# Patient Record
Sex: Female | Born: 1955 | Race: White | Hispanic: No | Marital: Married | Smoking: Former smoker
Health system: Southern US, Community
[De-identification: ages and names within clinical notes are randomized; demographics above are authoritative.]

## PROBLEM LIST (undated history)

## (undated) DIAGNOSIS — M810 Age-related osteoporosis without current pathological fracture: Secondary | ICD-10-CM

## (undated) DIAGNOSIS — Z9289 Personal history of other medical treatment: Secondary | ICD-10-CM

## (undated) DIAGNOSIS — M1990 Unspecified osteoarthritis, unspecified site: Secondary | ICD-10-CM

## (undated) DIAGNOSIS — E559 Vitamin D deficiency, unspecified: Secondary | ICD-10-CM

## (undated) DIAGNOSIS — R748 Abnormal levels of other serum enzymes: Secondary | ICD-10-CM

## (undated) HISTORY — DX: Unspecified osteoarthritis, unspecified site: M19.90

## (undated) HISTORY — DX: Age-related osteoporosis without current pathological fracture: M81.0

## (undated) HISTORY — DX: Personal history of other medical treatment: Z92.89

## (undated) HISTORY — DX: Abnormal levels of other serum enzymes: R74.8

## (undated) HISTORY — PX: TRIGGER FINGER RELEASE: SHX641

## (undated) HISTORY — DX: Vitamin D deficiency, unspecified: E55.9

---

## 1998-05-22 ENCOUNTER — Other Ambulatory Visit: Admission: RE | Admit: 1998-05-22 | Discharge: 1998-05-22 | Payer: Self-pay | Admitting: Obstetrics and Gynecology

## 1999-10-29 ENCOUNTER — Other Ambulatory Visit: Admission: RE | Admit: 1999-10-29 | Discharge: 1999-10-29 | Payer: Self-pay | Admitting: Obstetrics and Gynecology

## 2000-10-27 ENCOUNTER — Other Ambulatory Visit: Admission: RE | Admit: 2000-10-27 | Discharge: 2000-10-27 | Payer: Self-pay | Admitting: Obstetrics and Gynecology

## 2001-11-05 ENCOUNTER — Other Ambulatory Visit: Admission: RE | Admit: 2001-11-05 | Discharge: 2001-11-05 | Payer: Self-pay | Admitting: Obstetrics and Gynecology

## 2002-11-11 ENCOUNTER — Other Ambulatory Visit: Admission: RE | Admit: 2002-11-11 | Discharge: 2002-11-11 | Payer: Self-pay | Admitting: Obstetrics and Gynecology

## 2003-11-18 ENCOUNTER — Other Ambulatory Visit: Admission: RE | Admit: 2003-11-18 | Discharge: 2003-11-18 | Payer: Self-pay | Admitting: Obstetrics and Gynecology

## 2004-09-22 ENCOUNTER — Encounter: Admission: RE | Admit: 2004-09-22 | Discharge: 2004-09-22 | Payer: Self-pay | Admitting: Orthopedic Surgery

## 2004-11-16 ENCOUNTER — Encounter: Admission: RE | Admit: 2004-11-16 | Discharge: 2004-11-16 | Payer: Self-pay | Admitting: Orthopedic Surgery

## 2004-12-09 ENCOUNTER — Other Ambulatory Visit: Admission: RE | Admit: 2004-12-09 | Discharge: 2004-12-09 | Payer: Self-pay | Admitting: Obstetrics and Gynecology

## 2005-02-07 HISTORY — PX: HIP ARTHROSCOPY W/ LABRAL REPAIR: SHX1750

## 2005-03-21 ENCOUNTER — Ambulatory Visit (HOSPITAL_COMMUNITY): Admission: RE | Admit: 2005-03-21 | Discharge: 2005-03-21 | Payer: Self-pay | Admitting: Orthopedic Surgery

## 2005-12-13 ENCOUNTER — Other Ambulatory Visit: Admission: RE | Admit: 2005-12-13 | Discharge: 2005-12-13 | Payer: Self-pay | Admitting: Obstetrics & Gynecology

## 2006-12-21 ENCOUNTER — Other Ambulatory Visit: Admission: RE | Admit: 2006-12-21 | Discharge: 2006-12-21 | Payer: Self-pay | Admitting: Obstetrics & Gynecology

## 2007-02-08 HISTORY — PX: CARPAL TUNNEL RELEASE: SHX101

## 2007-07-21 LAB — HM COLONOSCOPY

## 2008-02-20 ENCOUNTER — Other Ambulatory Visit: Admission: RE | Admit: 2008-02-20 | Discharge: 2008-02-20 | Payer: Self-pay | Admitting: Obstetrics & Gynecology

## 2010-06-25 NOTE — Op Note (Signed)
NAMEJAMILEX, Mindy Juarez NO.:  [id number]   MEDICAL RECORD NO.:  [id number]          PATIENT TYPE:  AMB   LOCATION:  DAY                          FACILITY:  [facility]   PHYSICIAN:  Ollen Gross, M.D.    DATE OF BIRTH:  1955/08/16   DATE OF PROCEDURE:  03/21/2005  DATE OF DISCHARGE:                                 OPERATIVE REPORT   PREOPERATIVE DIAGNOSIS:  Left hip labral tear.   POSTOPERATIVE DIAGNOSIS:  Left hip labral tear.   PROCEDURE:  Left hip arthroscopy with labral debridement.   SURGEON:  Ollen Gross, M.D.   ASSISTANT:  Avel Peace, PA-C.   ANESTHESIA:  General.   ESTIMATED BLOOD LOSS:  Minimal.   DRAINS:  None.   COMPLICATIONS:  None.   CONDITION:  Stable to recovery.   CLINICAL NOTE:  Mindy Juarez is a 55 year old female with significant left  hip pain and mechanical symptoms. Exam and history suggested a labral tear.  She has had intractable pain and presents now for arthroscopy and  debridement.   DESCRIPTION OF PROCEDURE:  After successful administration of general  anesthetic, the patient is placed in the right lateral decubitus position  with the left side up on the fracture table. The well-padded perineal post  is placed with the left thigh draped over the post. Her left foot is put  into the well-holder foot holder. Under fluoroscopic guidance, traction is  applied until the joint is adequately distracted. Traction is locked in that  position and then the thigh prepped and draped in the usual sterile fashion.  The standard anterior and posterior super trochanteric portals are  identified and spinal needles passed from those sites down into the joint.  Saline is injected through the posterior needle and shows outflow through  the anterior needle confirming that both are intra-articular. The nitinol  wire is passed through the posterior needle and the needle was removed. A  small incision is made and the dilator is placed. The 5 mm  cannula was  placed and then the camera is passed into the joint and inflow initiated.  The femoral head looked normal throughout. The fovea looked normal. The  entire anterior half of the joint looked normal including the labrum and  acetabular cartilage. Looking superior at the 12 o'clock position going all  the way to the 3 o'clock position, there is evidence of a tear in the  posterior superior labrum with significant fraying also. This appears to be  an unstable tear but there is no evidence of any labral detachment. We then  created the anterior portal and switched the camera anterior and the working  portal posterior. Using a combination of the 4.2 mm shaver and the  ArthroCare device, the tear in the labrum is debrided back to a stable base  and sealed off with the ArthroCare. There is no evidence of any chondral  defect on the acetabulum. The rest of the labrum posteriorly and inferiorly  looked fine. Once the debridement is completed then 20 mL of 0.5% Marcaine  with epi are injected through the inflow cannula and  the arthroscopic  equipment is removed. The traction is released from the joint and a  fluoroscopic picture confirms that concentric reduction is achieved. The  incisions are then closed with interrupted 4-0 nylon and a bulky sterile  dressing applied. She is then awakened and transported to recovery in stable  condition.      Ollen Gross, M.D.  Electronically Signed     FA/MEDQ  D:  03/21/2005  T:  03/22/2005  Job:  [id number]

## 2011-04-01 LAB — HM PAP SMEAR: HM Pap smear: NEGATIVE

## 2012-04-19 LAB — HM DEXA SCAN

## 2012-06-05 ENCOUNTER — Ambulatory Visit (INDEPENDENT_AMBULATORY_CARE_PROVIDER_SITE_OTHER): Payer: BC Managed Care – PPO | Admitting: Sports Medicine

## 2012-06-05 VITALS — BP 120/70 | Ht 66.0 in | Wt 143.0 lb

## 2012-06-05 DIAGNOSIS — M13161 Monoarthritis, not elsewhere classified, right knee: Secondary | ICD-10-CM

## 2012-06-05 DIAGNOSIS — M25569 Pain in unspecified knee: Secondary | ICD-10-CM

## 2012-06-05 DIAGNOSIS — M25561 Pain in right knee: Secondary | ICD-10-CM

## 2012-06-05 DIAGNOSIS — M1710 Unilateral primary osteoarthritis, unspecified knee: Secondary | ICD-10-CM

## 2012-06-05 NOTE — Patient Instructions (Addendum)
Try knee sleeve for activity and for 30 min to 1 hr after- do not sleep in knee sleeve  Ice if you have been doing a lot of activity  Please follow up in 4 weeks  Weight bearing activity- (walking or running) 3-4 times per week does not usually increase arthritis  Thank you for seeing [place] today!

## 2012-06-05 NOTE — Assessment & Plan Note (Signed)
Because of the large effusion we will use compression sleeve unless this creates pain in PF groove  Very good strength  Avoid bent knee exercises  Limit running or weight bearing sports  Reck p 2 mos

## 2012-06-05 NOTE — Assessment & Plan Note (Signed)
Primarily will use OTC meds and ice for pain  If more severe we can add tramadol

## 2012-06-05 NOTE — Progress Notes (Signed)
  Subjective:    Patient ID: Mindy Juarez, female    DOB: 1955/08/21, 57 y.o.   MRN: [id number]  HPI  Pt presents to clinic for evaluation of rt knee pain. Worse with going up and downstairs.  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 In her 86s lost 60 lbs. Now teaching strictly water classes. Does circuit training, weight training, running and power walking. Exercises 6-7 days per week.   At times has warmth at medial joint line on rt.   Dr. Despina Hick- Hx of lt hip labral repair 2007 Had x-rays of lt hip and knee- no arthritis in hip, some in knee in 2012      Review of Systems     Objective:   Physical Exam  Slightly more hypertrophy of medial joint line R>L  Moderate crepitation bilat knees Excellent straight leg raise flexibility SI joints move freely bilat Leg lengths equal Good quad strength bilat -2 deg full extension on rt knee Full extension on lt 160 deg knee flexion bilat Hip abduction strength good bilat Hip flexion strength good on rt  Rt knee- puffiness in suprapatellar pouch  MSK [place] RT knee Large effusion in SP pouch Meniscus unremarable med and lat Very mild joint line spurring Pat Tendon OK Quad tendon OK  However suprerior retropatellar groove shows calcified cartilage and loss of joint space This is not present on left knee         Assessment & Plan:

## 2012-07-31 ENCOUNTER — Ambulatory Visit (INDEPENDENT_AMBULATORY_CARE_PROVIDER_SITE_OTHER): Payer: BC Managed Care – PPO | Admitting: Sports Medicine

## 2012-07-31 VITALS — BP 133/72 | Ht 66.0 in | Wt 143.0 lb

## 2012-07-31 DIAGNOSIS — M25569 Pain in unspecified knee: Secondary | ICD-10-CM

## 2012-07-31 DIAGNOSIS — M25561 Pain in right knee: Secondary | ICD-10-CM

## 2012-07-31 DIAGNOSIS — M13161 Monoarthritis, not elsewhere classified, right knee: Secondary | ICD-10-CM

## 2012-07-31 NOTE — Assessment & Plan Note (Signed)
Pain at acceptable level except when she gets swelling

## 2012-07-31 NOTE — Progress Notes (Signed)
  Subjective:    Patient ID: Mindy Juarez, female    DOB: 1955-09-13, 57 y.o.   MRN: [id number]  HPI 57 yo female who presents for follow up on right knee pain.  Pain is located mostly in posterior aspect of knee and medial aspect. Intermittent. She had a flare up of pain and swelling 2 weeks ago and doesn't recall any change in activity at the time. She took 2 aleve. She wore her compression sleeve during the days following this which improved pain and swelling.  She has been able to run up to 2-3 miles without significant pain. 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 [employer]  Review of Systems Negative except per HPI    Objective:   Physical Exam Filed Vitals:   07/31/12 1551  BP: 133/72   General: pleasant, athletic appearing woman Right Knee: mild suprapatellar effusion palpated, no fullness on posterior aspect of knee no tenderness to palpation Normal knee flexion and extension with 5/5 strength, 5/5 strength quad strength 5/5 abduction strength bilaterally, 5/5 adduction strength bilaterally  MSK Ultrasound: Right suprapatellar pouch effusion present, stable from previous exam Improved effusion under vastus lateralis and vastus medialis tendons.  Parameniscal cyst is no longer present medially Note lateral meniscus better seen and does not appear torn Calcification in the retropatellar cartilage of RT knee only     Assessment & Plan:  57 yo female with osteoarthritis who presents for follow up on right knee pain which has improved clinically.  Suprapatellar effusion still present on ultrasound.  - continue aggressive compression to bring swelling down - avoid running sprints for cardio - follow up in 6-8 weeks for monitoring of swelling

## 2012-07-31 NOTE — Assessment & Plan Note (Signed)
This is clinically improved  Mild improvement on [place]  Cont to work on this  Use compression consistently  There is some milder DJD change along joint lines

## 2012-07-31 NOTE — Patient Instructions (Addendum)
Compression sleeve like you're doing and increased wear if you notice any increased pain.  Wearing arch pads. If too bad in left, you can take it out.   Avoid running sprints.   Follow up in 6 weeks.

## 2012-09-11 ENCOUNTER — Ambulatory Visit: Payer: BC Managed Care – PPO | Admitting: Sports Medicine

## 2012-10-02 ENCOUNTER — Ambulatory Visit (INDEPENDENT_AMBULATORY_CARE_PROVIDER_SITE_OTHER): Payer: BC Managed Care – PPO | Admitting: Sports Medicine

## 2012-10-02 VITALS — BP 106/60 | Ht 66.0 in | Wt 145.0 lb

## 2012-10-02 DIAGNOSIS — M13161 Monoarthritis, not elsewhere classified, right knee: Secondary | ICD-10-CM

## 2012-10-02 NOTE — Progress Notes (Signed)
Mindy Juarez is a 57 y.o. female who presents today for f/u of R knee pain.  She was last seen 07/31/12 for suprapatellar effusion with patellofemoral arthritis.  At that time, she was given compression sleeve, strength exercises and OTC pain medication as needed.  Since that point, pain has improved slightly but she is now starting to feel a "clicking" sensation or instability when she is walking up or down stairs.  She denies actual locking, catching, or her knee giving out and denies recent effusion/erythema around the area.  Pain is mainly located behind the knee cap with minimal pain on the medial joint line, which is worse with rising or going down stairs.  OTC anti-inflammatories provided minimal relief and she has continued to use the compression sleeve.    ROS: Per HPI.  All other systems reviewed and are negative.   Physical Exam Filed Vitals:   10/02/12 1507  BP: 106/60    Physical Examination: General: NAD  Right Knee: no effusion noted, no gross deformity or erythema, no fullness on posterior aspect of knee  Minimal tenderness to palpation medial joint line + patellar crepitus with flexion/extension Normal knee flexion and extension with 5/5 F/E strength  5/5 abduction strength bilaterally, 5/5 adduction strength bilaterally  Ligaments with solid consistent endpoints including ACL, PCL, LCL, MCL. + Thessaly on R side Patellar and quadriceps tendons unremarkable. Hamstring and quadriceps strength is normal.   MSK Ultrasound:  Right suprapatellar pouch effusion present, stable from previous exam, possible minimal improvement  Parameniscal cyst is no longer present medially on the R side  Intact lateral meniscus with small calcification on the tibial plateau  Calcification in the retropatellar cartilage of RT knee visible is minimally visualized compared to previous exam

## 2012-10-02 NOTE — Assessment & Plan Note (Signed)
Pt has improved to where she is having only pain with hills, either up or down.  Now c/o clicking and possible feeling of instability.  This may be related to either effusion or possible intraarticular meniscal tear.  For now, continue with compression sleeve as this seems to be greatly improving her Sx.  Continue with water exercises and will see back in three months.

## 2012-12-25 ENCOUNTER — Encounter: Payer: Self-pay | Admitting: Sports Medicine

## 2012-12-25 ENCOUNTER — Ambulatory Visit (INDEPENDENT_AMBULATORY_CARE_PROVIDER_SITE_OTHER): Payer: BC Managed Care – PPO | Admitting: Sports Medicine

## 2012-12-25 VITALS — BP 136/84 | Ht 66.0 in | Wt 145.0 lb

## 2012-12-25 DIAGNOSIS — M25561 Pain in right knee: Secondary | ICD-10-CM

## 2012-12-25 DIAGNOSIS — M25569 Pain in unspecified knee: Secondary | ICD-10-CM

## 2012-12-25 DIAGNOSIS — M13161 Monoarthritis, not elsewhere classified, right knee: Secondary | ICD-10-CM

## 2012-12-25 NOTE — Assessment & Plan Note (Signed)
This appears stable and I am not sure how much OA is present as some of swelling may be degenerative meniscus

## 2012-12-25 NOTE — Progress Notes (Signed)
Patient ID: NIMSI MALES, female   DOB: 21-Sep-1955, 57 y.o.   MRN: [id number]  SUBJECTIVE: Mindy Juarez is a 57 y.o. female who presents today for f/u of R knee pain. She was last seen 10/02/12 for suprapatellar effusion with patellofemoral arthritis. At that time, she was given compression sleeve, strength exercises and OTC pain medication as needed. Since that point, pain has improved significantly and has been able to increase her running of a 5K and improved her pace.  She denies actual locking, catching, or her knee has not been giving out and denies recent effusion/erythema around the area. Pain is mainly located behind the patella with minimal pain on the medial joint line with clicking and popping, which is worse with rising or going down stairs. OTC anti-inflammatories provided relief and she has continued to use the compression sleeve. She rarely uses NSAID'S at this point.  ROS:  All other systems reviewed and are negative except what is present in HPI  OBJECTIVE: Physical Examination:  General: NAD  Neuro: A&O x3. Sensation to light touch lower extremities equal and intact bilaterally.  Normal walking and runnig gait in lower torso and hips with some upper body shifting of shoulders and head bobbing. Right Knee: no effusion noted on exam, no gross deformity or erythema, no fullness on posterior aspect of knee  Minimal tenderness to palpation medial joint line and no tenderness on the lateral joint line + patellar crepitus with clicking and popping with flexion/extension  Normal knee flexion and extension with 5/5 F/E strength  4/5 abduction strength on R compared to L, 5/5 adduction strength bilaterally  Ligaments stable with consistent endpoints including ACL, PCL, LCL, MCL.  Patellar and quadriceps tendons unremarkable with no TTP Hamstring and quadriceps strength is 5/5 bilaterally  MSK Ultrasound:  Right suprapatellar pouch effusion mild and decrease in size  significantly Parameniscal cyst is no longer present medially on the R side  Intact lateral meniscus with small calcification on the tibial plateau  Calcification in the retropatellar cartilage of RT knee visible is minimally visualized compared to previous exam Normal QT, PT   ASSESSMENT/PLAN: 1. Suspected medial mensicus tear with resolution of cyst and improvement in suprapatellar recesses effusion  For now, continue with compression sleeve with running and all activities. Avoid aggressive side stepping and shuffling. Continue advancing running. Return in 6 months or soon if needed.

## 2012-12-25 NOTE — Assessment & Plan Note (Addendum)
This continues to improve  Needs to use compression sleeve  HEP  Keep running mileage low  Reck 6 mos

## 2013-04-12 ENCOUNTER — Ambulatory Visit: Payer: Self-pay | Admitting: Nurse Practitioner

## 2013-05-13 ENCOUNTER — Encounter: Payer: Self-pay | Admitting: Nurse Practitioner

## 2013-05-14 ENCOUNTER — Ambulatory Visit (INDEPENDENT_AMBULATORY_CARE_PROVIDER_SITE_OTHER): Payer: BC Managed Care – PPO | Admitting: Nurse Practitioner

## 2013-05-14 ENCOUNTER — Encounter: Payer: Self-pay | Admitting: Nurse Practitioner

## 2013-05-14 VITALS — BP 110/66 | HR 48 | Ht 65.75 in | Wt 147.0 lb

## 2013-05-14 DIAGNOSIS — Z01419 Encounter for gynecological examination (general) (routine) without abnormal findings: Secondary | ICD-10-CM

## 2013-05-14 DIAGNOSIS — Z0000 Encounter for general adult medical examination without abnormal findings: Secondary | ICD-10-CM

## 2013-05-14 LAB — HEMOGLOBIN, FINGERSTICK: HEMOGLOBIN, FINGERSTICK: 13 g/dL (ref 12.0–16.0)

## 2013-05-14 LAB — POCT URINALYSIS DIPSTICK
Bilirubin, UA: NEGATIVE
Blood, UA: NEGATIVE
GLUCOSE UA: NEGATIVE
Ketones, UA: NEGATIVE
Leukocytes, UA: NEGATIVE
Nitrite, UA: NEGATIVE
Protein, UA: NEGATIVE
Urobilinogen, UA: NEGATIVE
pH, UA: 6

## 2013-05-14 NOTE — Progress Notes (Signed)
Encounter reviewed by Dr. Babs Dabbs Silva.  

## 2013-05-14 NOTE — Patient Instructions (Signed)

## 2013-05-14 NOTE — Progress Notes (Signed)
Patient ID: Mindy Juarez, female   DOB: [birth year], 58 y.o.   MRN: [id number] 57 y.o. [id number] Married Caucasian Fe here for annual exam.   She feels well.  recent problems with mothers health who is 24 yrs.old.  She fell in December and had to have extensive repair of femur. Patient also concerned about her history of osteopenia and is doing core strengthening and weight bearing exercise.  Patient's last menstrual period was 11/08/2006.          Sexually active: no  The current method of family planning is post menopausal status.    Exercising: yes  Home exercise routine includes aerobic or strength training 6 times per week. Smoker:  no  Health Maintenance: Pap:  04/01/11, WNL, neg HR HPV MMG:  02/19/13 3D;  Bi-Rads 1: negative Colonoscopy:  07/2007, repeat in 10 years  BMD:  3/14; -2.4/-0.9/-0.5 TDaP:  12/2006 Labs:  HB:  13.0 Urine:  Negative    reports that she has never smoked. She has never used smokeless tobacco. She reports that she drinks alcohol. She reports that she does not use illicit drugs.  History reviewed. No pertinent past medical history.  Past Surgical History  Procedure Laterality Date  . Carpal tunnel release Right 2009  . Cesarean section      2  . Trigger finger release      thumb  . Hip arthroscopy w/ labral repair Left 2007    Current Outpatient Prescriptions  Medication Sig Dispense Refill  . b complex vitamins tablet Take 1 tablet by mouth daily.      . Calcium Carbonate-Vitamin D (CALCIUM + D PO) Take by mouth.      . cholecalciferol (VITAMIN D) 1000 UNITS tablet Take 1,000 Units by mouth daily.      Marland Kitchen glucosamine-chondroitin 500-400 MG tablet Take 1 tablet by mouth 3 (three) times daily.      . Omega-3 Fatty Acids (FISH OIL PO) Take by mouth.      . pyridOXINE (VITAMIN B-6) 100 MG tablet Take 100 mg by mouth daily.      . Turmeric Curcumin 500 MG CAPS Take by mouth.       No current facility-administered medications for this visit.    Family  History  Problem Relation Age of Onset  . Osteoporosis Mother   . Hypertension Mother   . Hyperlipidemia Mother   . Heart failure Mother     ROS:  Pertinent items are noted in HPI.  Otherwise, a comprehensive ROS was negative.  Exam:   BP 110/66  Pulse 48  Ht 5' 5.75" (1.67 m)  Wt 147 lb (66.679 kg)  BMI 23.91 kg/m2  LMP 11/08/2006 Height: 5' 5.75" (167 cm)  Ht Readings from Last 3 Encounters:  05/14/13 5' 5.75" (1.67 m)  12/25/12 5\' 6"  (1.676 m)  10/02/12 5\' 6"  (1.676 m)    General appearance: alert, cooperative and appears stated age Head: Normocephalic, without obvious abnormality, atraumatic Neck: no adenopathy, supple, symmetrical, trachea midline and thyroid normal to inspection and palpation Lungs: clear to auscultation bilaterally Breasts: normal appearance, no masses or tenderness Heart: regular rate and rhythm Abdomen: soft, non-tender; no masses,  no organomegaly Extremities: extremities normal, atraumatic, no cyanosis or edema Skin: Skin color, texture, turgor normal. No rashes or lesions Lymph nodes: Cervical, supraclavicular, and axillary nodes normal. No abnormal inguinal nodes palpated Neurologic: Grossly normal   Pelvic: External genitalia:  no lesions  Urethra:  normal appearing urethra with no masses, tenderness or lesions              Bartholin's and Skene's: normal                 Vagina: normal appearing vagina with normal color and discharge, no lesions              Cervix: anteverted              Pap taken: no Bimanual Exam:  Uterus:  normal size, contour, position, consistency, mobility, non-tender              Adnexa: no mass, fullness, tenderness               Rectovaginal: Confirms               Anus:  normal sphincter tone, no lesions  A:  Well Woman with normal exam  Postmenopausal no HRT  Osteopenia   History of Vit D deficiency  Atrophic vaginitis - does not want treatment  P:   Pap smear as per guidelines   Mammogram  due 1/16  Counseled on breast self exam, mammography screening, osteoporosis, adequate intake of calcium and vitamin D, diet and exercise, Kegel's exercises return annually or prn  An After Visit Summary was printed and given to the patient.

## 2013-06-04 ENCOUNTER — Ambulatory Visit (INDEPENDENT_AMBULATORY_CARE_PROVIDER_SITE_OTHER): Payer: BC Managed Care – PPO | Admitting: Sports Medicine

## 2013-06-04 ENCOUNTER — Encounter: Payer: Self-pay | Admitting: Sports Medicine

## 2013-06-04 VITALS — BP 123/76 | Ht 66.0 in | Wt 143.0 lb

## 2013-06-04 DIAGNOSIS — M25529 Pain in unspecified elbow: Secondary | ICD-10-CM

## 2013-06-04 DIAGNOSIS — M7701 Medial epicondylitis, right elbow: Secondary | ICD-10-CM | POA: Insufficient documentation

## 2013-06-04 DIAGNOSIS — IMO0002 Reserved for concepts with insufficient information to code with codable children: Secondary | ICD-10-CM

## 2013-06-04 DIAGNOSIS — M7700 Medial epicondylitis, unspecified elbow: Secondary | ICD-10-CM

## 2013-06-04 DIAGNOSIS — M25521 Pain in right elbow: Secondary | ICD-10-CM | POA: Insufficient documentation

## 2013-06-04 DIAGNOSIS — M1710 Unilateral primary osteoarthritis, unspecified knee: Secondary | ICD-10-CM

## 2013-06-04 MED ORDER — NITROGLYCERIN 0.2 MG/HR TD PT24: MEDICATED_PATCH | TRANSDERMAL | Status: DC

## 2013-06-04 NOTE — Assessment & Plan Note (Signed)
Continue on the current program  She is more active and has less pain over the knee  Continued commpression as that seemed to really help

## 2013-06-04 NOTE — Progress Notes (Signed)
Patient ID: Mindy Juarez, female   DOB: Apr 16, 1955, 58 y.o.   MRN: [id number]  Patient comes back for followup of her right knee pain and patellofemoral arthritis She feels this is improved significantly with using a compression sleeve She takes a number of supplements for inflammation  She also has some hand complaints and has had surgery for a trigger thumb on the right She gets pain around some of the hand joints  Right elbow is her most troubling problem today She has had at least 2 months of what called golfer's elbow No response to the dry needling more to acupuncture Painful to do any curls or lifting with the  right arm  No acute distress  BP 123/76  Ht 5\' 6"  (1.676 m)  Wt 143 lb (64.864 kg)  BMI 23.09 kg/m2  LMP 11/08/2006  Right elbow shows full range of motion  Normal tearing and ankle  No swelling  Tenderness over the medial epicondyle that is made worse with resistance   Right knee now shows good range of motion  There is some slight puffiness in the suprapatellar pouch but not as much as before  Remainder of the knee exam is stable   MSK ultrasound  Right elbow shows 1 area of abnormal Doppler activity  There is hypoechoic change seen in the flexor tendon insertion into the medial epicondyle  This is best visualized on transverse view   Right knee continues to show a suprapatellar pouch effusion.the amount of effusion seems to be diminished

## 2013-06-04 NOTE — Assessment & Plan Note (Signed)
Start with a home exercise program  Compression sleeve for the elbow  Nitroglycerin protocol  Rescan in 6 weeks

## 2013-06-04 NOTE — Patient Instructions (Signed)
Please do suggested exercises daily for right elbow, and hips  Please start nitroglycerin patches - 1/4 patch daily to right elbow  Nitroglycerin Protocol   Apply 1/4 nitroglycerin patch to affected area daily.  Change position of patch within the affected area every 24 hours.  You may experience a headache during the first 1-2 weeks of using the patch, these should subside.  If you experience headaches after beginning nitroglycerin patch treatment, you may take your preferred over the counter pain reliever.  Another side effect of the nitroglycerin patch is skin irritation or rash related to patch adhesive.  Please notify our office if you develop more severe headaches or rash, and stop the patch.  Tendon healing with nitroglycerin patch may require 12 to 24 weeks depending on the extent of injury.  Men should not use if taking Viagra, Cialis, or Levitra.   Do not use if you have migraines or rosacea.   Please follow up in 6 weeks  Thank you for seeing [place] today!

## 2013-06-04 NOTE — Assessment & Plan Note (Signed)
She will continue using over-the-counter medications and will monitor her response to the nitroglycerin

## 2013-07-16 ENCOUNTER — Ambulatory Visit: Payer: BC Managed Care – PPO | Admitting: Sports Medicine

## 2013-07-17 ENCOUNTER — Encounter: Payer: Self-pay | Admitting: Sports Medicine

## 2013-07-17 ENCOUNTER — Ambulatory Visit (INDEPENDENT_AMBULATORY_CARE_PROVIDER_SITE_OTHER): Payer: BC Managed Care – PPO | Admitting: Sports Medicine

## 2013-07-17 VITALS — BP 128/81 | Ht 66.0 in | Wt 140.0 lb

## 2013-07-17 DIAGNOSIS — M7701 Medial epicondylitis, right elbow: Secondary | ICD-10-CM

## 2013-07-17 DIAGNOSIS — M7700 Medial epicondylitis, unspecified elbow: Secondary | ICD-10-CM

## 2013-07-17 DIAGNOSIS — IMO0002 Reserved for concepts with insufficient information to code with codable children: Secondary | ICD-10-CM

## 2013-07-17 DIAGNOSIS — M1710 Unilateral primary osteoarthritis, unspecified knee: Secondary | ICD-10-CM

## 2013-07-17 NOTE — Assessment & Plan Note (Signed)
Feeling beter with smaller reactive effusion

## 2013-07-17 NOTE — Assessment & Plan Note (Signed)
Significantly improved clinically and on [place] Do HEP Cont NTG protocol to complete 12 weeks Reck 6 wks

## 2013-07-17 NOTE — Progress Notes (Signed)
Subjective:   CC: Follow up right elbow pain and discuss bilateral foot pain.  HPI:   Right elbow pain Patient was diagnosed with right medial epicondylitis with hypoechoic change in flexor tendon insertion into medial epicondyle 6 weeks ago, and started on home exercise program, nitroglycerin protocol, and compression sleeve with plan to rescan today. She has been using nitro patch and saw chiropracter who did adjustments to her elbow. She also has been limiting biceps curls, pushups and other similar exercises with her personal trainer. Together, these changes have improved pain but it is not gone. She was initially doing prescribed elbow exercises regularly but has dropped off on these and done none over the past 2 weeks. Denies redness, swelling, or heat. 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 She has taken aleve twice which has helped some but not completely relieved pain.  Bilateral foot pain Patient feels she is developing arthritis in her feet. She has a left lateral foot prominence that she noticed was painful mid-April and thinks had been growing prior to that but she had not noticed it. She has one on the right as well that is smaller. They are occasionally painful with her birkenstocks. She also noticed a right arch dorsal fullness that was red/hot a few weeks ago but has since improved. She wears good shoes (Naots, Burkenstocks, Danskos) and has orthotics she did not bring today that she feels maybe push her feet too lateral.  Right knee pain Decreased pain.   SH: Pt exercises with personal trainer    Objective:  Physical Exam BP 128/81  Ht 5\' 6"  (1.676 m)  Wt 140 lb (63.504 kg)  BMI 22.61 kg/m2  LMP 11/08/2006 GEN: NAD MSK: Right elbow: No deformity, tenderness, or erythema 5/5 elbow flexion, extension, pronation, and supination with some reproduced pain on flexion against resistance Left foot: lateral foot with mid-foot bony prominence that is  nontender and not erythematous or warm. 5th metatarsal varus deformity. Right foot: Lateral foot with mid-foot bony prominence that is nontender and not erythematous or warm, smaller than on left. Right foot with tarsometatarsal bossing  Bilateral lateral column breakdown Subluxation at MTP of both 5th joints with slight bunionettes  Loss of longitudinal arch, spreading of medial column, and mild ankle pronation bilaterally Good heel liftoff bilaterally  MSK [place]: Right elbow: Decreased hypoechoic area at medial epicondyle; tendon looks intact.  No neovessels today. Right knee: Decreased hypoechoic area in SP pouch; loss of medial articular cartilage under patella  Assessment:     Mindy Juarez is a 58 y.o. female with h/o right medial epicondylitis here for follow up and discussion of bilateral foot pain.    Plan:   Right elbow pain Most likely dx is medial epicondylitis. Symptoms improving but not resolved with compression, nitroglycerin protocol, and some compliance with HEP. Nontender on exam with good strength.  - Repeat US today with decrease in hypoechoic area. - Continue nitroglycerin protocol 6 weeks. - Emphasized importance of ball squeezes and curls. - Return in 6 weeks for f/u.  Bilateral foot pain Bilateral lateral mid-foot bony prominences likely due to lateral foot breakdown and loss of longitudinal arch. No signs of infection. No prior injury.  - Arch strap provided today. - Encouraged pt to bring shoes and orthotic to next visit in 6 weeks or make separate appt for orthotics sooner.  Right knee pain Fluid pocket reduced on [place], some arthritic change seen with loss of medial articular PFG cartilage. - Continue  using sleeve.     Mindy Sinclair, MD [place] and edited Mindy Mcgill, MD

## 2013-08-15 ENCOUNTER — Ambulatory Visit (INDEPENDENT_AMBULATORY_CARE_PROVIDER_SITE_OTHER): Payer: BC Managed Care – PPO | Admitting: Sports Medicine

## 2013-08-15 ENCOUNTER — Encounter: Payer: Self-pay | Admitting: Sports Medicine

## 2013-08-15 VITALS — BP 116/72 | Ht 66.0 in | Wt 145.0 lb

## 2013-08-15 DIAGNOSIS — M25579 Pain in unspecified ankle and joints of unspecified foot: Secondary | ICD-10-CM | POA: Insufficient documentation

## 2013-08-15 DIAGNOSIS — M25569 Pain in unspecified knee: Secondary | ICD-10-CM

## 2013-08-15 NOTE — Assessment & Plan Note (Signed)
I corrected 2 of her running shoes  3/4 lateral to medial wedge on one  Heel wedge lateral to medial on another  Alternate these and see if we can reduce lateral foot pain with either correction  If this works she may be able to cont sports insoles If not we need to make custom ortotics

## 2013-08-15 NOTE — Progress Notes (Signed)
Patient ID: Mindy Juarez, female   DOB: Jun 22, 1955, 57 y.o.   MRN: [id number]  Patient has had some chronic lateral foot pain Localizes over the lateral foot Mostly over base of 5MT Some pain over lateral 5th MTP Arch straps given last time helped short term but are uncomfortable after wearing for awhile  She had orthotics made in past but does not use They are well made but a bit too short for her current shoes  Exam NAD BP 116/72  Ht 5\' 6"  (1.676 m)  Wt 145 lb (65.772 kg)  BMI 23.41 kg/m2  LMP 11/08/2006  Bilateral feet show bunionettes Base of 5th MTP is prominent and looks to have varus shift Lateral foot column flattened  Some loss of long arch MT arch is stable  Walking gait is more supinated

## 2013-09-04 ENCOUNTER — Ambulatory Visit: Payer: BC Managed Care – PPO | Admitting: Sports Medicine

## 2013-09-17 ENCOUNTER — Ambulatory Visit (INDEPENDENT_AMBULATORY_CARE_PROVIDER_SITE_OTHER): Payer: BC Managed Care – PPO | Admitting: Sports Medicine

## 2013-09-17 ENCOUNTER — Encounter: Payer: Self-pay | Admitting: Sports Medicine

## 2013-09-17 VITALS — BP 114/77 | HR 58 | Ht 66.0 in | Wt 146.0 lb

## 2013-09-17 DIAGNOSIS — M25579 Pain in unspecified ankle and joints of unspecified foot: Secondary | ICD-10-CM

## 2013-09-17 DIAGNOSIS — M7700 Medial epicondylitis, unspecified elbow: Secondary | ICD-10-CM | POA: Diagnosis not present

## 2013-09-17 DIAGNOSIS — M7701 Medial epicondylitis, right elbow: Secondary | ICD-10-CM

## 2013-09-17 DIAGNOSIS — M25569 Pain in unspecified knee: Secondary | ICD-10-CM

## 2013-09-17 DIAGNOSIS — M25561 Pain in right knee: Secondary | ICD-10-CM

## 2013-09-17 NOTE — Progress Notes (Signed)
   Subjective:    Patient ID: Mindy Juarez, female    DOB: 1955/06/15, 58 y.o.   MRN: [id number]  HPI Mindy Juarez returns for follow up of right medial epicondylitis. She reports feeling better but the pain is not completely gone. Most days her pain is 3-4/10. She doesn't do any of her home exercises, but does work with a Physiological scientist who provides modified exercises 2x/wk and she teaches water aerobics 3x/wk. She continues to use the nitroglycerin patches with some relief. Pain is most noticeable with throwing and catching medicine ball (15lbs) which she does with her personal trainer. She has not needed any NSAIDs for pain relief.  She also mentions that she has not noticed a difference between the large heel wedge and the small heel wedge in her two different pairs of shoes.   Review of Systems     Objective:   Physical Exam Gen: well-dressed, well nourished woman  Right arm: full elbow extension and flexion without any pain. Tender to palpation at medial epicondyle. Pain reproduced with wrist flexion against resistance as well as 3rd-5th digit flexion against resistance but only in an eccentric position Good strength in concentric position  Breakdown of lateral column of both feet       Assessment & Plan:  **Right medial epicondylitis: - provided exercise regimen to supplement her work outs with Physiological scientist and water aerobics. See visit AVS for details of regimen. Goal is to avoid pain greater than 3/10 in these exercises. - follow up in 2 months  **bilateral foot pain: - replaced large heel pad with heel wedge to to the lateral side of her inserts.    Written by: Ivan Anchors, MS4

## 2013-09-17 NOTE — Assessment & Plan Note (Signed)
This is improved but somewhat of a plateau perhaps aggravated by her activity level  Will add some increased weight to concentric exercise More consistent eccentric exercise  Reck 2 mos

## 2013-09-17 NOTE — Patient Instructions (Addendum)
We want to limit your max pain level below 3/10. AVOID extending your elbow all the way for the following exercises.  1. Short arc bicep curls: 4 week regimen Start with 10 lbs and increase each week until 20 lbs by week 4 Week 1: 15 reps Week 2: 12 reps Week 3: 10 reps Week 4: 10 reps  2. For one day each week, do 3 position bicep curls: curls right in front of you, curls at 45 degrees, and curls all the way at your sides Do 8 reps at 10 lbs  3. Wrist curls at light weight  4. Soft ball squeezes. 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

## 2013-09-17 NOTE — Assessment & Plan Note (Signed)
We added heel wedges to 3 pairs of shoes  These are only lateral heel  If she does not get enough improvement in 2 months consider custom orthotics

## 2013-09-17 NOTE — Assessment & Plan Note (Addendum)
Improved To where she is able to run 3 and half miles  Keep using compression sleeve  Rescan for effusion in 2 months

## 2013-10-12 ENCOUNTER — Other Ambulatory Visit: Payer: Self-pay | Admitting: Sports Medicine

## 2013-11-12 ENCOUNTER — Other Ambulatory Visit: Payer: BC Managed Care – PPO | Admitting: Sports Medicine

## 2013-11-20 ENCOUNTER — Encounter: Payer: Self-pay | Admitting: Sports Medicine

## 2013-11-20 ENCOUNTER — Ambulatory Visit (INDEPENDENT_AMBULATORY_CARE_PROVIDER_SITE_OTHER): Payer: BC Managed Care – PPO | Admitting: Sports Medicine

## 2013-11-20 VITALS — BP 126/77 | Ht 66.0 in | Wt 146.0 lb

## 2013-11-20 DIAGNOSIS — M25521 Pain in right elbow: Secondary | ICD-10-CM | POA: Diagnosis not present

## 2013-11-20 DIAGNOSIS — M13161 Monoarthritis, not elsewhere classified, right knee: Secondary | ICD-10-CM | POA: Diagnosis not present

## 2013-11-20 NOTE — Assessment & Plan Note (Signed)
-  We encouraged her to add activities such as her, but to try to mobilize some of the fluid in the suprapatellar pouch. -We are encouraged that she is relatively symptom-free. -She'll follow up in 3-4 months or sooner if needed.

## 2013-11-20 NOTE — Assessment & Plan Note (Signed)
-  She may discontinue the ntg was protocol, which has done 2 weeks ago. -We discussed having her chiropractic therapist provide less aggressive techniques at the medial epicondyle and focus more on the distal flexor muscles. -She'll follow up in 3-4 months or sooner if needed.

## 2013-11-20 NOTE — Progress Notes (Signed)
   Subjective:    Patient ID: Mindy Juarez, female    DOB: [birth year], 58 y.o.   MRN: [id number]  HPI Mindy Juarez is a 58 year old female who presents for followup of right elbow and right knee pain.  She was last seen in June 2015 and found very rather substantial effusion of the right knee, thought to be secondary to degenerative joint disease. She has been wearing a compression sleeve and crosstraining.  He says that her knee pain is significantly improved today.  She has been running approximately 3 miles per day and doing yoga. The right elbow she says that her pain has somewhat persisted.  She has been using the miniature glycerin patch was 6 months.  She has been seeing a chiropractor, who has been rather aggressive with his active release of the right medial epicondyle.  The location of pain is at the right medial epicondyle.  She denies any associated swelling, bruising, or warmth.  She has also been trying a right elbow compression sleeve that she wears at night.  She is occasionally taking Aleve.  Past medical history, social history, medications, and allergies were reviewed and are up to date in the chart.  Review of Systems 7 point review of systems was performed and was otherwise negative unless noted in the history of present illness.     Objective:   Physical Exam BP 126/77  Ht 5\' 6"  (1.676 m)  Wt 146 lb (66.225 kg)  BMI 23.58 kg/m2  LMP 11/08/2006 GEN: The patient is well-developed well-nourished female and in no acute distress.  She is awake alert and oriented x3. SKIN: warm and well-perfused, no rash  EXTR: No lower extremity edema or calf tenderness Neuro: Strength 5/5 globally. Sensation intact throughout. DTRs 2/4 bilaterally. No focal deficits. Vasc: +2 bilateral distal pulses. No edema.  MSK: Examination of the right knee reveals full range of motion without pain.  There is a mild effusion present on exam.  She has no medial or lateral joint line tenderness.   Slightly positive patellar grind test.  Negative McMurray's.  Negative Lachman's.  She has no leg length discrepancy.  Examination of the right elbow reveals significant tenderness palpation at the tendinous insertion at the medial condyle.  Pain is reproduced with resisted pronation.  She has no elbow swelling and otherwise has full pain free range of motion.  Limited musculoskeletal ultrasound: Long and short axis views are obtained of the right knee and right elbow.  Examination of the suprapatellar space reveals a moderate sized effusion, which is relatively unchanged from prior exam.  There is arthritic changes seen at the femoral condyle and patellofemoral joint.  Examination of the right elbow reveals resolution of the previously noted tearing of the common flexor tendon.  There appears to be lessening her vascularity at the site.  There does appear to be a calcified scar from the previous area of the tear.  There are no abnormal fluid collections.     Assessment & Plan:  Please see problem based assessment and plan in the problem list.

## 2013-12-09 ENCOUNTER — Encounter: Payer: Self-pay | Admitting: Sports Medicine

## 2014-02-25 ENCOUNTER — Ambulatory Visit: Payer: BC Managed Care – PPO | Admitting: Sports Medicine

## 2014-03-06 ENCOUNTER — Ambulatory Visit
Admission: RE | Admit: 2014-03-06 | Discharge: 2014-03-06 | Disposition: A | Discharge status: Home / Self Care | Payer: BLUE CROSS/BLUE SHIELD | Source: Ambulatory Visit | Attending: Sports Medicine | Admitting: Sports Medicine

## 2014-03-06 ENCOUNTER — Ambulatory Visit (INDEPENDENT_AMBULATORY_CARE_PROVIDER_SITE_OTHER): Payer: BLUE CROSS/BLUE SHIELD | Admitting: Sports Medicine

## 2014-03-06 ENCOUNTER — Encounter: Payer: Self-pay | Admitting: Sports Medicine

## 2014-03-06 VITALS — BP 127/74 | Ht 66.0 in | Wt 146.0 lb

## 2014-03-06 DIAGNOSIS — M25511 Pain in right shoulder: Secondary | ICD-10-CM

## 2014-03-06 DIAGNOSIS — M25552 Pain in left hip: Secondary | ICD-10-CM | POA: Insufficient documentation

## 2014-03-06 DIAGNOSIS — M25512 Pain in left shoulder: Secondary | ICD-10-CM

## 2014-03-06 NOTE — Assessment & Plan Note (Addendum)
Physical exam and ultrasound findings consistent with AC joint injury.   Recommended patient modify exercise routine to reduce stress on AC joint. For example, avoid military presses and chest flys.   Use Aleve for days when painful only

## 2014-03-06 NOTE — Assessment & Plan Note (Addendum)
Concern for possible hip arthritis.   Plan: 1) X-ray of hips. Will call patient with results and discuss plan depending on what the x-rays demonstrate.   At this point would continue non painful exercises and cont to work hip ROM
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
## 2014-03-11 ENCOUNTER — Encounter: Payer: Self-pay | Admitting: Sports Medicine

## 2014-03-20 ENCOUNTER — Encounter: Payer: Self-pay | Admitting: Nurse Practitioner

## 2014-04-23 ENCOUNTER — Ambulatory Visit: Payer: BLUE CROSS/BLUE SHIELD | Admitting: Sports Medicine

## 2014-05-20 ENCOUNTER — Encounter: Payer: Self-pay | Admitting: Nurse Practitioner

## 2014-05-20 ENCOUNTER — Ambulatory Visit (INDEPENDENT_AMBULATORY_CARE_PROVIDER_SITE_OTHER): Payer: BC Managed Care – PPO | Admitting: Nurse Practitioner

## 2014-05-20 VITALS — BP 124/76 | HR 60 | Ht 66.0 in | Wt 150.0 lb

## 2014-05-20 DIAGNOSIS — Z01419 Encounter for gynecological examination (general) (routine) without abnormal findings: Secondary | ICD-10-CM | POA: Diagnosis not present

## 2014-05-20 DIAGNOSIS — Z87898 Personal history of other specified conditions: Secondary | ICD-10-CM

## 2014-05-20 DIAGNOSIS — Z0000 Encounter for general adult medical examination without abnormal findings: Secondary | ICD-10-CM

## 2014-05-20 DIAGNOSIS — Z9189 Other specified personal risk factors, not elsewhere classified: Secondary | ICD-10-CM

## 2014-05-20 DIAGNOSIS — Z1211 Encounter for screening for malignant neoplasm of colon: Secondary | ICD-10-CM

## 2014-05-20 LAB — CBC WITH DIFFERENTIAL/PLATELET
Basophils Absolute: 0 10*3/uL (ref 0.0–0.1)
Basophils Relative: 0 % (ref 0–1)
Eosinophils Absolute: 0.1 10*3/uL (ref 0.0–0.7)
Eosinophils Relative: 2 % (ref 0–5)
HEMATOCRIT: 33.5 % — AB (ref 36.0–46.0)
Hemoglobin: 11.2 g/dL — ABNORMAL LOW (ref 12.0–15.0)
LYMPHS PCT: 17 % (ref 12–46)
Lymphs Abs: 1.1 10*3/uL (ref 0.7–4.0)
MCH: 29.9 pg (ref 26.0–34.0)
MCHC: 33.4 g/dL (ref 30.0–36.0)
MCV: 89.6 fL (ref 78.0–100.0)
MPV: 10.1 fL (ref 8.6–12.4)
Monocytes Absolute: 0.3 10*3/uL (ref 0.1–1.0)
Monocytes Relative: 4 % (ref 3–12)
Neutro Abs: 5.1 10*3/uL (ref 1.7–7.7)
Neutrophils Relative %: 77 % (ref 43–77)
Platelets: 237 10*3/uL (ref 150–400)
RBC: 3.74 MIL/uL — ABNORMAL LOW (ref 3.87–5.11)
RDW: 14.5 % (ref 11.5–15.5)
WBC: 6.6 10*3/uL (ref 4.0–10.5)

## 2014-05-20 LAB — POCT URINALYSIS DIPSTICK
Bilirubin, UA: NEGATIVE
Blood, UA: NEGATIVE
Glucose, UA: NEGATIVE
Ketones, UA: NEGATIVE
Leukocytes, UA: NEGATIVE
Nitrite, UA: NEGATIVE
Protein, UA: NEGATIVE
Urobilinogen, UA: NEGATIVE
pH, UA: 6

## 2014-05-20 NOTE — Progress Notes (Signed)
Patient ID: Mindy Juarez, female   DOB: 12-10-1955, 59 y.o.   MRN: [id number] 58 y.o. [id number] Married  Caucasian Fe here for annual exam.  Feels well in general and has a good diet and exercise routine.  She wants BMD results done on 05/07/14.  See end of exam for results.  Gave blood 11 days ago and her HGB was 13.0.  No other signs of bleeding. Does not feel bad or fatigued.  Patient's last menstrual period was 11/08/2006.          Sexually active: No.  The current method of family planning is abstinence and post menopausal status.    Exercising: Yes.    cardio, strength training and stretching Smoker:  no  Health Maintenance: Pap:  04/01/11, negative with neg HR HPV MMG:  02/26/14, 3D, Bi-Rads 1:  Negative Colonoscopy:  07/2007, repeat in 10 years IFOB test is given BMD:   05/07/14, T Score -2.2 S/-0.7 R/-0.9 L TDaP:  12/2006 Labs:  HB:  10.8  Urine:  Negative    reports that she has never smoked. She has never used smokeless tobacco. She reports that she drinks alcohol. She reports that she does not use illicit drugs.  History reviewed. No pertinent past medical history.  Past Surgical History  Procedure Laterality Date  . Carpal tunnel release Right 2009  . Cesarean section      2  . Trigger finger release      thumb  . Hip arthroscopy w/ labral repair Left 2007    Current Outpatient Prescriptions  Medication Sig Dispense Refill  . b complex vitamins tablet Take 1 tablet by mouth daily.    . Calcium Carbonate-Vitamin D (CALCIUM + D PO) Take by mouth.    . cholecalciferol (VITAMIN D) 1000 UNITS tablet Take 1,000 Units by mouth daily.    Marland Kitchen glucosamine-chondroitin 500-400 MG tablet Take 1 tablet by mouth 3 (three) times daily.    . Omega-3 Fatty Acids (FISH OIL PO) Take by mouth.    . pyridOXINE (VITAMIN B-6) 100 MG tablet Take 100 mg by mouth daily.    . Turmeric Curcumin 500 MG CAPS Take by mouth.     No current facility-administered medications for this visit.    Family  History  Problem Relation Age of Onset  . Osteoporosis Mother   . Hypertension Mother   . Hyperlipidemia Mother   . Heart failure Mother     ROS:  Pertinent items are noted in HPI.  Otherwise, a comprehensive ROS was negative.  Exam:   BP 124/76 mmHg  Pulse 60  Ht 5\' 6"  (1.676 m)  Wt 150 lb (68.04 kg)  BMI 24.22 kg/m2  LMP 11/08/2006 Height: 5\' 6"  (167.6 cm) Ht Readings from Last 3 Encounters:  05/20/14 5\' 6"  (1.676 m)  03/06/14 5\' 6"  (1.676 m)  11/20/13 5\' 6"  (1.676 m)    General appearance: alert, cooperative and appears stated age Head: Normocephalic, without obvious abnormality, atraumatic Neck: no adenopathy, supple, symmetrical, trachea midline and thyroid normal to inspection and palpation Lungs: clear to auscultation bilaterally Breasts: normal appearance, no masses or tenderness Heart: regular rate and rhythm Abdomen: soft, non-tender; no masses,  no organomegaly Extremities: extremities normal, atraumatic, no cyanosis or edema Skin: Skin color, texture, turgor normal. No rashes or lesions Lymph nodes: Cervical, supraclavicular, and axillary nodes normal. No abnormal inguinal nodes palpated Neurologic: Grossly normal   Pelvic: External genitalia:  no lesions  Urethra:  normal appearing urethra with no masses, tenderness or lesions              Bartholin's and Skene's: normal                 Vagina: normal appearing vagina with normal color and discharge, no lesions              Cervix: anteverted              Pap taken: Yes.   Bimanual Exam:  Uterus:  normal size, contour, position, consistency, mobility, non-tender              Adnexa: no mass, fullness, tenderness               Rectovaginal: Confirms               Anus:  normal sphincter tone, no lesions  Chaperone present: no  A:  Well Woman with normal exam  Postmenopausal no HRT Osteopenia  History of Vit D deficiency Atrophic vaginitis - does not  want treatment  ? anemia   P:   Reviewed health and wellness pertinent to exam  Pap smear taken today  Mammogram is due 02/2015  Will follow with CBC with diff.  Also encouraged her to do IFOB testing  Will return for fating labs  Counseled on breast self exam, mammography screening, adequate intake of calcium and vitamin D, diet and exercise return annually or prn  Counseling about BMD results from 05/07/14:  The T score of the spine is still low at -2.2, left hip -0.9; right hip -0.7. The FRAX score for 10 year risk of major fracture is 26.1 % and for hip fracture is 10 years is 1.%.  She is informed of results and does not want medication management.  She does have [place] of bone loss.  She works out with a Clinical research associate 3 times a week with weights, does good calcium and Vit D.  Will plan to repeat BMD in 2 years. The overall results do show that she is stable compared to 2014.  An After Visit Summary was printed and given to the patient.

## 2014-05-20 NOTE — Patient Instructions (Signed)

## 2014-05-21 LAB — HEMOGLOBIN, FINGERSTICK: Hemoglobin, fingerstick: 10.8 g/dL — ABNORMAL LOW (ref 12.0–16.0)

## 2014-05-22 ENCOUNTER — Other Ambulatory Visit (INDEPENDENT_AMBULATORY_CARE_PROVIDER_SITE_OTHER): Payer: BC Managed Care – PPO

## 2014-05-22 ENCOUNTER — Ambulatory Visit: Payer: BLUE CROSS/BLUE SHIELD | Admitting: Sports Medicine

## 2014-05-22 ENCOUNTER — Encounter: Payer: Self-pay | Admitting: Nurse Practitioner

## 2014-05-22 DIAGNOSIS — D649 Anemia, unspecified: Secondary | ICD-10-CM

## 2014-05-22 DIAGNOSIS — Z0000 Encounter for general adult medical examination without abnormal findings: Secondary | ICD-10-CM

## 2014-05-22 LAB — IPS PAP TEST WITH HPV

## 2014-05-23 LAB — LIPID PANEL
Cholesterol: 149 mg/dL (ref 0–200)
HDL: 81 mg/dL (ref >=46)
LDL Cholesterol: 59 mg/dL (ref 0–99)
Total CHOL/HDL Ratio: 1.8 Ratio
Triglycerides: 43 mg/dL (ref <150)
VLDL: 9 mg/dL (ref 0–40)

## 2014-05-23 LAB — VITAMIN D 25 HYDROXY (VIT D DEFICIENCY, FRACTURES): Vit D, 25-Hydroxy: 49 ng/mL (ref 30–100)

## 2014-05-23 LAB — COMPREHENSIVE METABOLIC PANEL
ALBUMIN: 4.1 g/dL (ref 3.5–5.2)
ALT: 39 U/L — ABNORMAL HIGH (ref 0–35)
AST: 38 U/L — ABNORMAL HIGH (ref 0–37)
Alkaline Phosphatase: 65 U/L (ref 39–117)
BUN: 10 mg/dL (ref 6–23)
CO2: 24 mEq/L (ref 19–32)
Calcium: 9.4 mg/dL (ref 8.4–10.5)
Chloride: 106 mEq/L (ref 96–112)
Creat: 0.67 mg/dL (ref 0.50–1.10)
Glucose, Bld: 92 mg/dL (ref 70–99)
Potassium: 4.7 mEq/L (ref 3.5–5.3)
Sodium: 140 mEq/L (ref 135–145)
Total Bilirubin: 0.8 mg/dL (ref 0.2–1.2)
Total Protein: 6.6 g/dL (ref 6.0–8.3)

## 2014-05-23 LAB — TSH: TSH: 2.088 u[IU]/mL (ref 0.350–4.500)

## 2014-05-26 ENCOUNTER — Other Ambulatory Visit: Payer: Self-pay | Admitting: Nurse Practitioner

## 2014-05-26 DIAGNOSIS — R899 Unspecified abnormal finding in specimens from other organs, systems and tissues: Secondary | ICD-10-CM

## 2014-05-26 NOTE — Progress Notes (Signed)
Encounter reviewed by Dr. Laiden Milles Silva.  

## 2014-05-27 ENCOUNTER — Telehealth: Payer: Self-pay | Admitting: Nurse Practitioner

## 2014-05-27 NOTE — Telephone Encounter (Signed)
Left message regarding upcoming appointment has been canceled and needs to be rescheduled. °

## 2014-05-28 ENCOUNTER — Telehealth: Payer: Self-pay | Admitting: Nurse Practitioner

## 2014-05-28 ENCOUNTER — Other Ambulatory Visit: Payer: Self-pay

## 2014-05-28 NOTE — Telephone Encounter (Signed)
Patient called and left a message at lunch cancelling her lab appointment for "repeat LFT's." She will call back to reschedule.

## 2014-06-04 ENCOUNTER — Other Ambulatory Visit (INDEPENDENT_AMBULATORY_CARE_PROVIDER_SITE_OTHER): Payer: BC Managed Care – PPO

## 2014-06-04 DIAGNOSIS — R899 Unspecified abnormal finding in specimens from other organs, systems and tissues: Secondary | ICD-10-CM

## 2014-06-04 DIAGNOSIS — D649 Anemia, unspecified: Secondary | ICD-10-CM

## 2014-06-04 LAB — HEPATIC FUNCTION PANEL
ALBUMIN: 4.3 g/dL (ref 3.5–5.2)
ALT: 32 U/L (ref 0–35)
AST: 33 U/L (ref 0–37)
Alkaline Phosphatase: 73 U/L (ref 39–117)
Bilirubin, Direct: 0.2 mg/dL (ref 0.0–0.3)
Indirect Bilirubin: 0.5 mg/dL (ref 0.2–1.2)
Total Bilirubin: 0.7 mg/dL (ref 0.2–1.2)
Total Protein: 7 g/dL (ref 6.0–8.3)

## 2014-06-04 LAB — CBC
HEMATOCRIT: 38.2 % (ref 36.0–46.0)
Hemoglobin: 12.7 g/dL (ref 12.0–15.0)
MCH: 30.5 pg (ref 26.0–34.0)
MCHC: 33.2 g/dL (ref 30.0–36.0)
MCV: 91.6 fL (ref 78.0–100.0)
MPV: 9.8 fL (ref 8.6–12.4)
Platelets: 222 10*3/uL (ref 150–400)
RBC: 4.17 MIL/uL (ref 3.87–5.11)
RDW: 14.6 % (ref 11.5–15.5)
WBC: 4.3 10*3/uL (ref 4.0–10.5)

## 2014-06-12 NOTE — Telephone Encounter (Signed)
Patient has returned for repeat LFT's.   Closing encounter.

## 2014-07-24 ENCOUNTER — Encounter: Payer: Self-pay | Admitting: Sports Medicine

## 2014-07-24 ENCOUNTER — Ambulatory Visit (INDEPENDENT_AMBULATORY_CARE_PROVIDER_SITE_OTHER): Payer: BC Managed Care – PPO | Admitting: Sports Medicine

## 2014-07-24 VITALS — BP 119/59 | Ht 66.0 in | Wt 145.0 lb

## 2014-07-24 DIAGNOSIS — M25552 Pain in left hip: Secondary | ICD-10-CM

## 2014-07-24 DIAGNOSIS — M25561 Pain in right knee: Secondary | ICD-10-CM | POA: Diagnosis not present

## 2014-07-24 NOTE — Patient Instructions (Signed)
Thank you for coming to the [facility] Today. Your symptoms, physical exam, and ultrasound are suggestive of an injury to the piriformis muscle. This is a muscle in your buttocks that helps with hip rotation. We found a small swelling in it on ultrasound. This may have been injured in your fall while running last September. I suggest the following:  3 exercises: - step over lunge - step up lunge - standing on one foot hip rotation past midline   2 piriformis stretches:  - cross left leg over right knee and bend right knee towards chest - cross left leg over midline with straight left knee  These activities should strengthen and relax your piriformis and reduce your pain.

## 2014-07-24 NOTE — Progress Notes (Signed)
Subjective: Mindy Juarez is a 59 y.o. female with no relevant PMH seen today for followup of her left hip pain. She initially injured her left hip while running in September, and was seen at this clinic in January. X-rays showed no osteoarthritis or other bony abnormality (films missing from her chart but evaluated by me). The patient continues to be highly active. 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 She states that she has pain in the lateral and posterior aspect of her left him, as before. The pain is worst when getting up frmo sitting down, when walking uphill, or when lying on that left side. Yesterday she went for a 3.1 mile run, and that exacerbated her pain and is the cause for her visit today. She describes her pain as a throbbing achiness. She endorses instability, but denies any new trauma, radiation, or clicking/popping.   Objective: BP 119/59 mmHg  Ht 5\' 6"  (1.676 m)  Wt 145 lb (65.772 kg)  BMI 23.41 kg/m2  LMP 11/08/2006  Gen: Well-appearing, female in no acute distress  Left hip: Inspection: No atrophy, asymmetry, or irregularity appreciated.  Palpation: Nontender throughout Range of Motion: Intact and symmetric. Some pain at extremes of internal rotation. Strength: 5/5 strength throughout  Gait is without abnormality  Ultrasound: Examined entirety of left iliotibial band and around the left knee and greater tuberosity. Some effusion and hypoechoity was appreciated in the piriformis muscle; otherwise ultrasonongraphy was unremarkable.   Assessment and Plan: Mindy Juarez is a 59 y.o. female with no relevant PMH seen today for followup of her left hip pain. Her description of pain in the lateral and posterior aspect of her hip, together with pain with internal rotation and the hypoechoity appreciated on ultrasound of the piriformis muscle, in a runner who  sustained a hyperextension fall is suggestive of piriformis pathology. I explained the anatomy and function of the piriformis muscle, and the findings on ultrasound as a possible etiology for her hip pain. I advised her to do two different kind of piriformis stretches, as well as regular exercises including step lunges, x overlunges, and hip rotation while standing on one foot. I have asked her to follow up with me as necessary.in 6 to 8 weeks.  Note dictated by Yvone Neu, MS-4

## 2014-07-24 NOTE — Assessment & Plan Note (Signed)
Findings today most consistent with piriformis injury that has been slow to heal  Trial on stretches and HEP for piriformis  Could inject if not improvement

## 2014-07-24 NOTE — Assessment & Plan Note (Signed)
She still has effusion and needs to get a compression sleeve to use again on RT knee

## 2015-01-21 ENCOUNTER — Ambulatory Visit (INDEPENDENT_AMBULATORY_CARE_PROVIDER_SITE_OTHER): Payer: BC Managed Care – PPO | Admitting: Family Medicine

## 2015-01-21 ENCOUNTER — Encounter: Payer: Self-pay | Admitting: Family Medicine

## 2015-01-21 VITALS — BP 138/80 | Ht 66.0 in | Wt 145.0 lb

## 2015-01-21 DIAGNOSIS — M79672 Pain in left foot: Secondary | ICD-10-CM | POA: Diagnosis not present

## 2015-01-21 MED ORDER — TRAMADOL HCL 50 MG PO TABS: 50.0000 mg | ORAL_TABLET | Freq: Four times a day (QID) | ORAL | Status: DC | PRN

## 2015-01-21 MED ORDER — MELOXICAM 15 MG PO TABS: 15.0000 mg | ORAL_TABLET | Freq: Every day | ORAL | Status: DC

## 2015-01-21 NOTE — Progress Notes (Signed)
  MONCHELLE ZACCAGNINO - 59 y.o. female MRN [id number]  Date of birth: [birth year] Mindy Juarez is a 59 y.o. female who presents today for left foot pain.  Left foot pain, initial visit-patient presents today for ongoing midfoot plantar pain along the medial aspect there is been ongoing now for about 1 day. She does have history of medial knee pain and had an exacerbation over the past 2 days in which she altered her gait somewhat and went for several walks of prolonged activity and noticed last night that her pain was in the medial aspect of her left midfoot plantar/medial aspect. She has been having this twinge/tingling/paresthesias that shoots up her calf and into this region of her foot. Does not worsen with palpation and she has no tenderness to palpation or worsening with walking.  PMHx - Updated and reviewed.  Contributory factors include: Noncontributory PSHx - Updated and reviewed.  Contributory factors include: Noncontributory FHx - Updated and reviewed.  Contributory factors include:  Noncontributory Medications - updated and reviewed   ROS Per HPI.  12 point negative other than per HPI.   Exam:  Filed Vitals:   01/21/15 1514  BP: 138/80   Gen: NAD Cardiorespiratory - Normal respiratory effort/rate.  RRR Ankle/Foot L : No visible erythema or swelling. Range of motion is full in all directions. Strength is 5/5 in all directions. Stable lateral and medial ligaments; squeeze test and kleiger test unremarkable; Talar dome nontender; No pain at base of 5th MT; No tenderness over cuboid; No tenderness over N spot or navicular prominence No tenderness on posterior aspects of lateral and medial malleolus No sign of peroneal tendon subluxations; Negative tarsal tunnel tinel's Able to walk 4 steps.

## 2015-01-21 NOTE — Assessment & Plan Note (Signed)
Unknown etiology but suspect this is a component of nerve irritation possibly of the tibial nerve. Sensation is intact along with posterior tibialis tendon functioning. Differential is quite large including midfoot arthritis, tendinitis, tarsal tunnel however this is unusual and so without a previous injury. - Conservative management with tramadol and Mobic. -If no response in 5-7 days, would consider x-rays of the foot as well as shoe modification/eventually orthotics.  Could consider diagnostic/therapeutic injection in the tibial nerve region at the flexor retinaculum.  Eventually consider MRI L Foot

## 2015-05-28 ENCOUNTER — Ambulatory Visit: Payer: BC Managed Care – PPO | Admitting: Nurse Practitioner

## 2015-05-29 ENCOUNTER — Encounter: Payer: Self-pay | Admitting: *Deleted

## 2015-05-29 ENCOUNTER — Ambulatory Visit (INDEPENDENT_AMBULATORY_CARE_PROVIDER_SITE_OTHER): Payer: BC Managed Care – PPO | Admitting: Nurse Practitioner

## 2015-05-29 VITALS — BP 128/64 | Ht 65.75 in | Wt 150.0 lb

## 2015-05-29 DIAGNOSIS — Z01419 Encounter for gynecological examination (general) (routine) without abnormal findings: Secondary | ICD-10-CM

## 2015-05-29 DIAGNOSIS — D509 Iron deficiency anemia, unspecified: Secondary | ICD-10-CM

## 2015-05-29 DIAGNOSIS — Z0000 Encounter for general adult medical examination without abnormal findings: Secondary | ICD-10-CM

## 2015-05-29 LAB — POCT URINALYSIS DIPSTICK
Bilirubin, UA: NEGATIVE
Glucose, UA: NEGATIVE
Ketones, UA: NEGATIVE
Leukocytes, UA: NEGATIVE
NITRITE UA: NEGATIVE
Protein, UA: NEGATIVE
RBC UA: NEGATIVE
UROBILINOGEN UA: NEGATIVE
pH, UA: 6

## 2015-05-29 LAB — COMPREHENSIVE METABOLIC PANEL
ALT: 30 U/L — ABNORMAL HIGH (ref 6–29)
AST: 34 U/L (ref 10–35)
Albumin: 4.1 g/dL (ref 3.6–5.1)
Alkaline Phosphatase: 68 U/L (ref 33–130)
BUN: 10 mg/dL (ref 7–25)
CHLORIDE: 104 mmol/L (ref 98–110)
CO2: 26 mmol/L (ref 20–31)
Calcium: 9.3 mg/dL (ref 8.6–10.4)
Creat: 0.72 mg/dL (ref 0.50–1.05)
GLUCOSE: 81 mg/dL (ref 65–99)
POTASSIUM: 3.8 mmol/L (ref 3.5–5.3)
Sodium: 138 mmol/L (ref 135–146)
Total Bilirubin: 0.7 mg/dL (ref 0.2–1.2)
Total Protein: 7 g/dL (ref 6.1–8.1)

## 2015-05-29 LAB — CBC WITH DIFFERENTIAL/PLATELET
BASOS ABS: 42 {cells}/uL (ref 0–200)
Basophils Relative: 1 %
EOS PCT: 3 %
Eosinophils Absolute: 126 cells/uL (ref 15–500)
HCT: 38.7 % (ref 35.0–45.0)
Hemoglobin: 12.5 g/dL (ref 11.7–15.5)
Lymphocytes Relative: 32 %
Lymphs Abs: 1344 cells/uL (ref 850–3900)
MCH: 29.2 pg (ref 27.0–33.0)
MCHC: 32.3 g/dL (ref 32.0–36.0)
MCV: 90.4 fL (ref 80.0–100.0)
MPV: 10.2 fL (ref 7.5–12.5)
Monocytes Absolute: 294 cells/uL (ref 200–950)
Monocytes Relative: 7 %
NEUTROS ABS: 2394 {cells}/uL (ref 1500–7800)
NEUTROS PCT: 57 %
PLATELETS: 228 10*3/uL (ref 140–400)
RBC: 4.28 MIL/uL (ref 3.80–5.10)
RDW: 14.8 % (ref 11.0–15.0)
WBC: 4.2 10*3/uL (ref 3.8–10.8)

## 2015-05-29 LAB — LIPID PANEL
CHOL/HDL RATIO: 1.7 ratio (ref <=5.0)
Cholesterol: 159 mg/dL (ref 125–200)
HDL: 91 mg/dL (ref >=46)
LDL CALC: 61 mg/dL (ref <130)
Triglycerides: 35 mg/dL (ref <150)
VLDL: 7 mg/dL (ref <30)

## 2015-05-29 LAB — IBC PANEL
%SAT: 19 % (ref 11–50)
TIBC: 385 ug/dL (ref 250–450)
UIBC: 313 ug/dL (ref 125–400)

## 2015-05-29 LAB — FERRITIN: FERRITIN: 20 ng/mL (ref 10–232)

## 2015-05-29 LAB — TSH: TSH: 2.11 m[IU]/L

## 2015-05-29 LAB — IRON: Iron: 72 ug/dL (ref 45–160)

## 2015-05-29 NOTE — Progress Notes (Signed)
Reviewed personally.  M. Suzanne Lanea Vankirk, MD.  

## 2015-05-29 NOTE — Progress Notes (Signed)
Patient ID: Mindy Juarez, female   DOB: [birth year], 60 y.o.   MRN: [id number]  60 y.o. G47P2002 Married  Caucasian Fe here for annual exam.  She went to give blood on Monday and had low HGB at 11.5 and was told to get follow up here.  She has no obvious signs of bleeding.  She continues to run once a week and weight bearing.  Walking dog twice a day.  Mother is age 67 and has several health and pt is trying to get medication approved for insurance coverage.  Patient's last menstrual period was 11/08/2006 (approximate).          Sexually active: No.  The current method of family planning is post menopausal status.    Exercising: Yes.    cardio, strength and strecthing 6 days per week, hiking on Sunday. Smoker:  no  Health Maintenance: Pap: 05/20/14, Negative with neg HR HPV MMG: 03/29/15, 3D, Bi-Rads 1: Negative, [facility] Colonoscopy:  07/2007, repeat in 10 years BMD: 05/07/14 T Score: -2.2 Spine / -0.7 Right Femur Neck / -0.9 Left Femur Neck TDaP:  12/2006 Shingles: Not indicated due to age Pneumonia: Not indicated due to age Hep C and HIV: Hep C today, HIV ~1990 for work Labs: HB: 11.5 on 4/17 when trying to donate blood - HGB 12.1 Urine: negative   reports that she has never smoked. She has never used smokeless tobacco. She reports that she drinks alcohol. She reports that she does not use illicit drugs.  History reviewed. No pertinent past medical history.  Past Surgical History  Procedure Laterality Date  . Carpal tunnel release Right 2009  . Cesarean section  1987, 1991  . Trigger finger release      thumb  . Hip arthroscopy w/ labral repair Left 2007    Current Outpatient Prescriptions  Medication Sig Dispense Refill  . Ascorbic Acid (VITAMIN C) 1000 MG tablet Take 1,000 mg by mouth 2 (two) times daily.    Marland Kitchen b complex vitamins tablet Take 1 tablet by mouth daily.    . Calcium Carbonate-Vitamin D (CALCIUM + D PO) Take by mouth.    . cholecalciferol (VITAMIN D) 1000 UNITS tablet  Take 1,000 Units by mouth daily.    Marland Kitchen glucosamine-chondroitin 500-400 MG tablet Take 1 tablet by mouth 3 (three) times daily.    . Omega-3 Fatty Acids (FISH OIL PO) Take by mouth.    . pyridOXINE (VITAMIN B-6) 100 MG tablet Take 100 mg by mouth daily.    . Turmeric Curcumin 500 MG CAPS Take by mouth.     No current facility-administered medications for this visit.    Family History  Problem Relation Age of Onset  . Osteoporosis Mother   . Hypertension Mother   . Hyperlipidemia Mother   . Heart failure Mother   . Prostate cancer Father 4    no surgery  . Diabetes Maternal Aunt   . Diabetes Maternal Grandmother   . Heart failure Maternal Grandfather   . Stroke Paternal Grandfather     ROS:  Pertinent items are noted in HPI.  Otherwise, a comprehensive ROS was negative.  Exam:   BP 134/82 mmHg  Ht 5' 5.75" (1.67 m)  Wt 150 lb (68.04 kg)  BMI 24.40 kg/m2  LMP 11/08/2006 (Approximate) Height: 5' 5.75" (167 cm) Ht Readings from Last 3 Encounters:  05/29/15 5' 5.75" (1.67 m)  01/21/15 5\' 6"  (1.676 m)  07/24/14 5\' 6"  (1.676 m)    General appearance: alert, cooperative  and appears stated age Head: Normocephalic, without obvious abnormality, atraumatic Neck: no adenopathy, supple, symmetrical, trachea midline and thyroid normal to inspection and palpation Lungs: clear to auscultation bilaterally Breasts: normal appearance, no masses or tenderness Heart: regular rate and rhythm Abdomen: soft, non-tender; no masses,  no organomegaly Extremities: extremities normal, atraumatic, no cyanosis or edema Skin: Skin color, texture, turgor normal. No rashes or lesions Lymph nodes: Cervical, supraclavicular, and axillary nodes normal. No abnormal inguinal nodes palpated Neurologic: Grossly normal   Pelvic: External genitalia:  no lesions              Urethra:  normal appearing urethra with no masses, tenderness or lesions              Bartholin's and Skene's: normal                  Vagina: normal appearing vagina with normal color and discharge, no lesions              Cervix: anteverted              Pap taken: No. Bimanual Exam:  Uterus:  normal size, contour, position, consistency, mobility, non-tender              Adnexa: no mass, fullness, tenderness               Rectovaginal: Confirms               Anus:  normal sphincter tone, no lesions  Chaperone present: no  A:  Well Woman with normal exam  Postmenopausal no HRT Osteopenia  History of Vit D deficiency Atrophic vaginitis - does not want treatment ? anemia   P:   Reviewed health and wellness pertinent to exam  Pap smear as above  Mammogram is due 03/2016  Will follow with labs  Counseled on breast self exam, mammography screening, menopause, osteoporosis, adequate intake of calcium and vitamin D, diet and exercise, Kegel's exercises return annually or prn  An After Visit Summary was printed and given to the patient.

## 2015-05-29 NOTE — Addendum Note (Signed)
Addended by: Antonietta Barcelona on: 05/29/2015 03:45 PM   Modules accepted: Orders

## 2015-05-29 NOTE — Patient Instructions (Signed)

## 2015-05-30 LAB — VITAMIN D 25 HYDROXY (VIT D DEFICIENCY, FRACTURES): VIT D 25 HYDROXY: 56 ng/mL (ref 30–100)

## 2015-06-01 LAB — HEMOGLOBIN, FINGERSTICK: Hemoglobin, fingerstick: 12.5 g/dL (ref 12.0–16.0)

## 2015-06-22 ENCOUNTER — Ambulatory Visit
Admission: RE | Admit: 2015-06-22 | Discharge: 2015-06-22 | Disposition: A | Discharge status: Home / Self Care | Payer: BC Managed Care – PPO | Source: Ambulatory Visit | Attending: Sports Medicine | Admitting: Sports Medicine

## 2015-06-22 ENCOUNTER — Ambulatory Visit (INDEPENDENT_AMBULATORY_CARE_PROVIDER_SITE_OTHER): Payer: BC Managed Care – PPO | Admitting: Sports Medicine

## 2015-06-22 ENCOUNTER — Encounter: Payer: Self-pay | Admitting: Sports Medicine

## 2015-06-22 VITALS — BP 121/98 | HR 61 | Ht 66.0 in | Wt 150.0 lb

## 2015-06-22 DIAGNOSIS — M25552 Pain in left hip: Secondary | ICD-10-CM | POA: Diagnosis not present

## 2015-06-22 NOTE — Progress Notes (Signed)
   Subjective:    Patient ID: Mindy Juarez, female    DOB: December 14, 1955, 60 y.o.   MRN: [id number]  HPI  Mindy Juarez is a 60-y.o. female who presents for follow-up of left hip pain.  She was last seen for these complaints by Dr. Oneida Alar in June 2016.  Since that time, she endorses intermittent waxing and waning of her left hip pain, but she currently states that her symptoms have been "exacerbated" over the past month.  She describes a sharp, "pinching" pain that feels mainly localized within the left hip joint.  Pain is worse with walking and with standing after sitting for long periods of time.  She enjoys running, but is currently able to run only once a week for short distances due to her discomfort.  Denies low back pain, numbness/tingling, or swelling.  She does not typically take any medications for her pain.  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  Past medical history is significant for osteoarthritis of the right knee and bilateral feet.   Review of Systems As noted in HPI.    Objective:   Physical Exam  Left hip: Limited seated internal and external rotation of left hip compared to right.  Increased discomfort with internal rotation.  Motor strength 5/5 with pain on resisted hip flexion.  Left lower extremity is otherwise neurovascularly intact. Gait: Mildly antalgic gait.  Left hip x-ray obtained in January 2016 reviewed today.  Imaging reveals no acute abnormality.    Assessment & Plan:   Chronic left hip pain  Given description and duration of symptoms, patient's pain seems likely secondary to some component of osteoarthritis and degenerative joint disease.  Will obtain new hip x-rays at this time.  We will also refer her for formal physical therapy Barbaraann Barthel) in hopes of addressing both her hip pain and her various other pain complaints.  Discussed possibility of obtaining  an MRI and/or intraarticular joint injection in the future if her symptoms do not improve.  We will call with her x-ray results once we have received them.  Jenna E. Yolanda Bonine, M.D.  Seen and evaluated with the resident. I agree with the above plan of care. Patient likely has some degree of osteoarthritis which is causing her pain. I will start with getting updated x-rays of her left hip. I will call her with those results once available. I do think she would benefit from formal physical therapy and I have recommended Barbaraann Barthel for this. I also discussed the possibility of an intra-articular cortisone injection if her symptoms warrant. We had a long discussion about activity and using pain as her guide. I've encouraged her to stay active, but she does understand that certain activities such as running may make her hip pain more noticeable.

## 2015-06-23 ENCOUNTER — Telehealth: Payer: Self-pay | Admitting: Sports Medicine

## 2015-06-23 NOTE — Telephone Encounter (Signed)
  I left a message on the patient's voicemail concerning x-rays of her hips. When compared to x-rays done in January 2016 there has been no progression. Her arthritis is mild on her x-ray but clinically I think this is her pain generator. She will proceed with physical therapy as we discussed yesterday in the office. We can also consider an intra-articular cortisone injection if symptoms persist. She will follow-up as needed.

## 2016-02-08 DIAGNOSIS — R748 Abnormal levels of other serum enzymes: Secondary | ICD-10-CM

## 2016-02-08 HISTORY — DX: Abnormal levels of other serum enzymes: R74.8

## 2016-04-05 ENCOUNTER — Encounter: Payer: Self-pay | Admitting: Sports Medicine

## 2016-04-05 ENCOUNTER — Ambulatory Visit (INDEPENDENT_AMBULATORY_CARE_PROVIDER_SITE_OTHER): Payer: BC Managed Care – PPO | Admitting: Sports Medicine

## 2016-04-05 VITALS — BP 131/73 | HR 66 | Ht 66.0 in | Wt 145.0 lb

## 2016-04-05 DIAGNOSIS — M25552 Pain in left hip: Secondary | ICD-10-CM | POA: Diagnosis not present

## 2016-04-05 DIAGNOSIS — M79672 Pain in left foot: Secondary | ICD-10-CM

## 2016-04-05 MED ORDER — DICLOFENAC SODIUM 1 % TD GEL: 2.0000 g | Freq: Four times a day (QID) | TRANSDERMAL | 3 refills | Status: DC

## 2016-04-06 NOTE — Progress Notes (Signed)
  Mindy Juarez - 61 y.o. female MRN [id number]  Date of birth: Feb 07, 1956  SUBJECTIVE:  Including CC & ROS.   Mindy Juarez is a 61 yo F that is following up for her left hip pain and presenting with bilateral dorsal foot pain. She has had slow improvement of her hip and it started improving since January. She was in PT since PT but only recently started improving. She reports being 50% improved. She has some limitation of her function and pain on the alteral aspect of her hip. Denies any giving way. She hasn't tried any medications.   She also has bilateral foot pain. The pain is occurring on the dorsal midfoot. She denies any injury. Hasn't tried any medications. She has had orthotics made before. This is acute on chronic in nature. Started about 3 years ago. Worse at the end of the day and after activity.   Reports a history of a possible septic right hip when she was 61 years old. She was placed in a lower body cast for some time. She also had a left hip arthroscopy performed in 2007 for a labral debridement.   ROS: No unexpected weight loss, fever, chills, swelling, instability, numbness/tingling, redness, otherwise see HPI    HISTORY: Past Medical, Surgical, Social, and Family History Reviewed & Updated per EMR.   Pertinent Historical Findings include: PMSHx -  Left hip arthroscopy    DATA REVIEWED: 03/06/14: left hip x-ray: Mild degenerative change is present in the lower lumbar spine.  PHYSICAL EXAM:  VS: BP:131/73  HR:66bpm  TEMP: ( )  RESP:   HT:5\' 6"  (167.6 cm)   WT:145 lb (65.8 kg)  BMI:23.5 PHYSICAL EXAM: Gen: NAD, alert, cooperative with exam, well-appearing HEENT: clear conjunctiva, EOMI CV:  no edema, capillary refill brisk,  Resp: non-labored, normal speech Skin: no rashes, normal turgor  Neuro: no gross deficits.  Psych:  alert and oriented Left HIP:  Some tenderness to palpation of the greater trochanter on the left. No tenderness to palpation over the  piriformis or SI joints. Limited internal next rotation of the hips bilaterally. Normal hip flexion. Normal strength with hip flexion to resistance Normal knee flexion and extension She has weak hip abduction on the left Feet: Tarsal metatarsal bossing in the midfoot of each foot. Hypertrophy of the first MTP on the right. Cavus foot when nonweightbearing button flattens out upon standing up. Neurovascularly intact   ASSESSMENT & PLAN:   Left hip pain She has ongoing lateral left hip pain possible she could have some underlying greater trochanteric bursitis she has been having recent improvement with physical therapy she still has residual weakness with hip abduction. - Continue course of physical therapy - try voltaren over the lateral hip. - Can try an injection in the future.  Acute pain of left foot She has significant changes in her midfoot that are consistent with arthritis. 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 She also has loss of her transverse arch.  - Green sport insoles size 7.5-8.5  - can also try voltaren gel over the areas of pain in her feet.  - can consider x-ray in the future.

## 2016-04-06 NOTE — Assessment & Plan Note (Signed)
She has ongoing lateral left hip pain possible she could have some underlying greater trochanteric bursitis she has been having recent improvement with physical therapy she still has residual weakness with hip abduction. - Continue course of physical therapy - try voltaren over the lateral hip. - Can try an injection in the future.

## 2016-04-06 NOTE — Assessment & Plan Note (Signed)
She has significant changes in her midfoot that are consistent with arthritis. 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 She also has loss of her transverse arch.  - Green sport insoles size 7.5-8.5  - can also try voltaren gel over the areas of pain in her feet.  - can consider x-ray in the future.

## 2016-05-16 DIAGNOSIS — M810 Age-related osteoporosis without current pathological fracture: Secondary | ICD-10-CM

## 2016-05-16 HISTORY — DX: Age-related osteoporosis without current pathological fracture: M81.0

## 2016-05-19 ENCOUNTER — Ambulatory Visit (INDEPENDENT_AMBULATORY_CARE_PROVIDER_SITE_OTHER): Payer: BC Managed Care – PPO | Admitting: Sports Medicine

## 2016-05-19 ENCOUNTER — Encounter: Payer: Self-pay | Admitting: Sports Medicine

## 2016-05-19 DIAGNOSIS — G8929 Other chronic pain: Secondary | ICD-10-CM

## 2016-05-19 DIAGNOSIS — M25511 Pain in right shoulder: Secondary | ICD-10-CM | POA: Diagnosis not present

## 2016-05-19 DIAGNOSIS — M25552 Pain in left hip: Secondary | ICD-10-CM | POA: Diagnosis not present

## 2016-05-19 DIAGNOSIS — M1710 Unilateral primary osteoarthritis, unspecified knee: Secondary | ICD-10-CM

## 2016-05-19 DIAGNOSIS — M25512 Pain in left shoulder: Secondary | ICD-10-CM | POA: Diagnosis not present

## 2016-05-19 NOTE — Progress Notes (Signed)
Follow UP: Left hip pain  Working with [facility] PT Pain at lateral hip is 70% less Feels exercise program is working  Did a lot of activity and felt bilat ant shoulder pain Mostly yard work  RT knee is much improved with less sxs  Both feet - pain in dorsum unless using sports insoles Needs to use more often in walking shoes Walks up to 2 miles day  ROS No generalized joint swelling No giving way of RT knee  PEXAM Pleasant F in NAD BP 130/64   Pulse 64   Ht 5\' 6"  (1.676 m)   Wt 145 lb (65.8 kg)   LMP 11/08/2006 (Approximate)   BMI 23.40 kg/m   Left Hip Normal ROM exc flexion and ext strength Still moderate weakness on hip abduction  Shoulders show TTP over AC joints RC testing is OK  RT knee with only mild crepitation  Feet with midfoot bossing on dorsum

## 2016-05-19 NOTE — Assessment & Plan Note (Signed)
Exercise regimen and PT have helped and doing well

## 2016-05-19 NOTE — Assessment & Plan Note (Signed)
Needs to add regular weight work with ankle weights until left hip abductors strong enough PT has helped but not at 100% Fortunately I don't think there is significant OA as she has good ROM  REck 3 mos

## 2016-05-19 NOTE — Assessment & Plan Note (Signed)
She flared this again with using clippers Avoid activity that compresses AC joints  PRN use of NSAIDs

## 2016-06-01 ENCOUNTER — Telehealth: Payer: Self-pay | Admitting: Nurse Practitioner

## 2016-06-01 NOTE — Telephone Encounter (Signed)
Please let pt now that BMD done on 05/16/16 shows the T Score at the spine at -2.70; right hip at  -1.10; left hip neck at -1.0.  The lowest is at the spine and is in the Osteoporosis range.  Comparison to last study 05/07/14 there has been a -6% loss at the spine and right hip. No FRAX score is done due to spine measurements.  Please see if pt will come in and consult with Dr. Quincy Simmonds about other causes of bone loss and possible treatment options.

## 2016-06-02 ENCOUNTER — Encounter: Payer: Self-pay | Admitting: Nurse Practitioner

## 2016-06-02 NOTE — Telephone Encounter (Signed)
Left message to call Acxel Dingee at [phone]. 

## 2016-06-02 NOTE — Telephone Encounter (Signed)
Spoke with patient. Advised of results and message as seen below from Kem Boroughs, [place]. Patient verbalizes understanding. Patient declines to schedule an appointment with Dr.Silva at this time. States she would like to review results with Kem Boroughs, FNP at her appointment for aex on 06/10/2016 first.  Routing to provider for final review. Patient agreeable to disposition. Will close encounter.

## 2016-06-10 ENCOUNTER — Ambulatory Visit (INDEPENDENT_AMBULATORY_CARE_PROVIDER_SITE_OTHER): Payer: BC Managed Care – PPO | Admitting: Nurse Practitioner

## 2016-06-10 ENCOUNTER — Encounter: Payer: Self-pay | Admitting: Nurse Practitioner

## 2016-06-10 VITALS — BP 120/74 | HR 64 | Ht 65.5 in | Wt 152.0 lb

## 2016-06-10 DIAGNOSIS — M810 Age-related osteoporosis without current pathological fracture: Secondary | ICD-10-CM

## 2016-06-10 DIAGNOSIS — R5383 Other fatigue: Secondary | ICD-10-CM | POA: Diagnosis not present

## 2016-06-10 DIAGNOSIS — Z01411 Encounter for gynecological examination (general) (routine) with abnormal findings: Secondary | ICD-10-CM

## 2016-06-10 DIAGNOSIS — Z0000 Encounter for general adult medical examination without abnormal findings: Secondary | ICD-10-CM

## 2016-06-10 DIAGNOSIS — D509 Iron deficiency anemia, unspecified: Secondary | ICD-10-CM | POA: Diagnosis not present

## 2016-06-10 DIAGNOSIS — R899 Unspecified abnormal finding in specimens from other organs, systems and tissues: Secondary | ICD-10-CM | POA: Diagnosis not present

## 2016-06-10 DIAGNOSIS — R202 Paresthesia of skin: Secondary | ICD-10-CM

## 2016-06-10 DIAGNOSIS — R200 Anesthesia of skin: Secondary | ICD-10-CM | POA: Diagnosis not present

## 2016-06-10 LAB — CBC WITH DIFFERENTIAL/PLATELET
BASOS PCT: 1 %
Basophils Absolute: 46 cells/uL (ref 0–200)
EOS ABS: 184 {cells}/uL (ref 15–500)
Eosinophils Relative: 4 %
HCT: 37.4 % (ref 35.0–45.0)
Hemoglobin: 12.5 g/dL (ref 11.7–15.5)
Lymphocytes Relative: 34 %
Lymphs Abs: 1564 cells/uL (ref 850–3900)
MCH: 30.3 pg (ref 27.0–33.0)
MCHC: 33.4 g/dL (ref 32.0–36.0)
MCV: 90.8 fL (ref 80.0–100.0)
MONOS PCT: 6 %
MPV: 10 fL (ref 7.5–12.5)
Monocytes Absolute: 276 cells/uL (ref 200–950)
NEUTROS ABS: 2530 {cells}/uL (ref 1500–7800)
Neutrophils Relative %: 55 %
PLATELETS: 210 10*3/uL (ref 140–400)
RBC: 4.12 MIL/uL (ref 3.80–5.10)
RDW: 13.6 % (ref 11.0–15.0)
WBC: 4.6 10*3/uL (ref 3.8–10.8)

## 2016-06-10 NOTE — Progress Notes (Signed)
61 y.o. G61P2002 Married  Caucasian Fe here for annual exam.  Has had 11 months of PT for left hip right shoulder.  Also seeing chiropractor. She is quite distressed about multiple bone and muscle issues when she has always been healthy and works out 5-6 days a week.  She has also had anemia and recent abnormal labs.  She wants her to have some labs done to evaluate this and possible other problems with tingling of the feet and legs.  Mother age 25 and several health issues including Osteoporosis with fracture on Fosamax.    Patient's last menstrual period was 11/08/2006 (approximate).          Sexually active: Yes.    The current method of family planning is post menopausal status.    Exercising: Yes.    cardio, strength, walking Smoker:  no  Health Maintenance: Pap: 05/20/14, Negative with neg HR HPV  04/01/11, Negative with neg HR HPV History of Abnormal Pap: no MMG: 03/21/16, 3D-yes, Density Category C, Bi-Rads 1:  Negative Self Breast exams: no Colonoscopy: 07/21/07, Normal, repeat in 10 years BMD: 05/16/16 T Score: -2.70 Spine / -1.10 Right Femur Neck / -1.0 Left Femur Neck TDaP: 12/2006 Shingles: will get with PCP, has checked with insurance for coverage Pneumonia: Not indicated due to age Hep C: done today HIV: 1990 with work Labs: PCP takes care of all labs, several abnormal in 05/2016   reports that she has never smoked. She has never used smokeless tobacco. She reports that she drinks alcohol. She reports that she does not use drugs.  Past Medical History:  Diagnosis Date  . Osteoporosis 05/16/2016    Past Surgical History:  Procedure Laterality Date  . CARPAL TUNNEL RELEASE Right 2009  . [place]  . HIP ARTHROSCOPY W/ LABRAL REPAIR Left 2007  . TRIGGER FINGER RELEASE     thumb    Current Outpatient Prescriptions  Medication Sig Dispense Refill  . Ascorbic Acid (VITAMIN C) 1000 MG tablet Take 1,000 mg by mouth 2 (two) times daily.    Marland Kitchen b complex  vitamins tablet Take 1 tablet by mouth daily.    . Calcium Carbonate-Vitamin D (CALCIUM + D PO) Take by mouth.    . cholecalciferol (VITAMIN D) 1000 UNITS tablet Take 4,000 Units by mouth daily.     . diclofenac sodium (VOLTAREN) 1 % GEL Apply 2 g topically 4 (four) times daily. 100 g 3  . glucosamine-chondroitin 500-400 MG tablet Take 1 tablet by mouth 3 (three) times daily.    . Omega-3 Fatty Acids (FISH OIL PO) Take by mouth.    . pyridOXINE (VITAMIN B-6) 100 MG tablet Take 100 mg by mouth daily.    Marland Kitchen Spirulina POWD Take by mouth daily.    . Turmeric Curcumin 500 MG CAPS Take by mouth.     No current facility-administered medications for this visit.     Family History  Problem Relation Age of Onset  . Osteoporosis Mother   . Hypertension Mother   . Hyperlipidemia Mother   . Heart failure Mother   . Prostate cancer Father 65    no surgery  . Diabetes Maternal Aunt   . Diabetes Maternal Grandmother   . Heart failure Maternal Grandfather   . Stroke Paternal Grandfather     ROS:  Pertinent items are noted in HPI.  Otherwise, a comprehensive ROS was negative.  Exam:   BP 120/74 (BP Location: Right Arm, Patient Position: Sitting, Cuff Size:  Normal)   Pulse 64   Ht 5' 5.5" (1.664 m)   Wt 152 lb (68.9 kg)   LMP 11/08/2006 (Approximate)   BMI 24.91 kg/m  Height: 5' 5.5" (166.4 cm) Ht Readings from Last 3 Encounters:  06/10/16 5' 5.5" (1.664 m)  05/19/16 5\' 6"  (1.676 m)  04/05/16 5\' 6"  (1.676 m)    General appearance: alert, cooperative and appears stated age Head: Normocephalic, without obvious abnormality, atraumatic Neck: no adenopathy, supple, symmetrical, trachea midline and thyroid normal to inspection and palpation Lungs: clear to auscultation bilaterally Breasts: normal appearance, no masses or tenderness Heart: regular rate and rhythm Abdomen: soft, non-tender; no masses,  no organomegaly Extremities: extremities normal, atraumatic, no cyanosis or edema Skin: Skin  color, texture, turgor normal. No rashes or lesions Lymph nodes: Cervical, supraclavicular, and axillary nodes normal. No abnormal inguinal nodes palpated Neurologic: Grossly normal   Pelvic: External genitalia:  no lesions              Urethra:  normal appearing urethra with no masses, tenderness or lesions              Bartholin's and Skene's: normal                 Vagina: very atrophic appearing vagina with normal color and discharge, no lesions              Cervix: anteverted              Pap taken: Yes.   Bimanual Exam:  Uterus:  normal size, contour, position, consistency, mobility, non-tender              Adnexa: no mass, fullness, tenderness               Rectovaginal: Confirms               Anus:  normal sphincter tone, no lesions  Chaperone present: yes  A:  Well Woman with normal exam  Postmenopausal no HRT Osteoporosis of the spine History of Vit D deficiency Atrophic vaginitis - does not want treatment History of anemia - vegetarian  Abnormal liver test  Tingling of extremities  Fatigue    P:   Reviewed health and wellness pertinent to exam  Pap smear: yes  Mammogram is due 03/2017  Will follow up with labs  We have reviewed BMD results - still may need a consult with Dr. Quincy Simmonds  We have reviewed her last labs  Counseled on breast self exam, mammography screening, osteoporosis, adequate intake of calcium and vitamin D, diet and exercise, Kegel's exercises return annually or prn  An After Visit Summary was printed and given to the patient.

## 2016-06-10 NOTE — Patient Instructions (Signed)

## 2016-06-11 LAB — MAGNESIUM: MAGNESIUM: 2.1 mg/dL (ref 1.5–2.5)

## 2016-06-11 LAB — HEPATIC FUNCTION PANEL
ALT: 43 U/L — ABNORMAL HIGH (ref 6–29)
AST: 50 U/L — AB (ref 10–35)
Albumin: 4.2 g/dL (ref 3.6–5.1)
Alkaline Phosphatase: 77 U/L (ref 33–130)
BILIRUBIN DIRECT: 0.2 mg/dL (ref <=0.2)
BILIRUBIN TOTAL: 0.6 mg/dL (ref 0.2–1.2)
Indirect Bilirubin: 0.4 mg/dL (ref 0.2–1.2)
Total Protein: 6.8 g/dL (ref 6.1–8.1)

## 2016-06-11 LAB — VITAMIN B12: Vitamin B-12: 767 pg/mL (ref 200–1100)

## 2016-06-11 LAB — THYROID PANEL WITH TSH
FREE THYROXINE INDEX: 2.4 (ref 1.4–3.8)
T3 Uptake: 32 % (ref 22–35)
T4, Total: 7.5 ug/dL (ref 4.5–12.0)
TSH: 3.09 mIU/L

## 2016-06-11 LAB — IRON: IRON: 113 ug/dL (ref 45–160)

## 2016-06-11 LAB — IBC PANEL
%SAT: 30 % (ref 11–50)
TIBC: 374 ug/dL (ref 250–450)
UIBC: 261 ug/dL (ref 125–400)

## 2016-06-11 LAB — HEPATITIS C ANTIBODY: HCV AB: NEGATIVE

## 2016-06-11 LAB — FERRITIN: FERRITIN: 28 ng/mL (ref 20–288)

## 2016-06-11 LAB — HEMOGLOBIN A1C
HEMOGLOBIN A1C: 5.5 % (ref <5.7)
MEAN PLASMA GLUCOSE: 111 mg/dL

## 2016-06-12 NOTE — Progress Notes (Signed)
Encounter reviewed by Dr. Aundria Rud. I recommend consultation for osteoporosis.

## 2016-06-13 ENCOUNTER — Telehealth: Payer: Self-pay | Admitting: Nurse Practitioner

## 2016-06-13 ENCOUNTER — Other Ambulatory Visit: Payer: Self-pay | Admitting: Nurse Practitioner

## 2016-06-13 DIAGNOSIS — R748 Abnormal levels of other serum enzymes: Secondary | ICD-10-CM

## 2016-06-13 LAB — THYROID PEROXIDASE ANTIBODY: Thyroperoxidase Ab SerPl-aCnc: 4 IU/mL (ref <9)

## 2016-06-13 LAB — C-REACTIVE PROTEIN: CRP: 0.5 mg/L (ref <8.0)

## 2016-06-13 LAB — PTH, INTACT AND CALCIUM
CALCIUM: 9.5 mg/dL (ref 8.6–10.4)
PTH: 55 pg/mL (ref 14–64)

## 2016-06-13 NOTE — Telephone Encounter (Signed)
See results note about recent labs.  Needs referral to see Dr. Collene Mares - order is placed.  Also needs apt to see Dr. Quincy Simmonds about osteoporosis. Please call her to arrange.

## 2016-06-14 ENCOUNTER — Encounter: Payer: Self-pay | Admitting: Obstetrics and Gynecology

## 2016-06-14 NOTE — Telephone Encounter (Signed)
Spoke with patient. Patient scheduled with Dr. Quincy Simmonds on 06/17/16 at 10:45am to discuss osteoporosis. Advised patient our office referral department will return call for scheduling with Dr. Collene Mares. Patient verbalizes understanding and is agreeable.  Routing to provider for final review. Patient is agreeable to disposition. Will close encounter.  Cc: Dr. Quincy Simmonds, Theresia Lo

## 2016-06-14 NOTE — Telephone Encounter (Signed)
Patient returning phone call. States that she will be in a meeting within the next 10 min. Patient can be reached at [phone]

## 2016-06-14 NOTE — Telephone Encounter (Signed)
Left message to call Mindy Juarez at [phone].  

## 2016-06-16 LAB — IPS PAP TEST WITH HPV

## 2016-06-16 NOTE — Progress Notes (Signed)
GYNECOLOGY  VISIT   HPI: 61 y.o.   Married  Caucasian  female   G2P2002 with Patient's last menstrual period was 11/08/2006 (approximate).   here to discuss bone density results.    Feels angry about having osteoporosis because she states she did it all correctly to care for herself.  "I just want someone to listen." Worried about atypical bone fractures.  Wants a temporary solution and not a long term solution. Has studied medication treatments in detail already.  Mother had osteoporosis and was tx with Fosamax and had atypical fx at age 84. Mother is on Prolia and has significant copayment for this.   Father had a stroke.  Teaches two water exercises classes, does cardio and weight bearing exercise.   Not a smoker.  ETOH, not over 2 per day.   Taking calcium and vit D for the last 6 years. Patient is vegetarian.  PTH 55 and calcium 9.5 on 06/10/16.  Has elevated LFTs and will see Dr. Collene Mares.  GYNECOLOGIC HISTORY: Patient's last menstrual period was 11/08/2006 (approximate). Contraception: Postmenopausal Menopausal hormone therapy:  none Last mammogram:03/21/16, 3D-yes, Density Category C, Bi-Rads 1:  Negative  Last pap smear:   05/20/14 Negative with neg HR HPV;   04/01/11 Negative with neg HR HPV        OB History    Gravida Para Term Preterm AB Living   2 2 2  0 0 2   SAB TAB Ectopic Multiple Live Births   0 0 0 0 2         Patient Active Problem List   Diagnosis Date Noted  . Acute pain of left foot 01/21/2015  . Left hip pain 03/06/2014  . Shoulder pain, bilateral 03/06/2014  . Pain in joint, ankle and foot 08/15/2013  . Right elbow pain 06/04/2013  . Medial epicondylitis of right elbow 06/04/2013  . Knee pain 06/05/2012  . 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 Elevated liver enzymes 2018  . Osteoporosis 05/16/2016    Past Surgical History:  Procedure Laterality Date  . CARPAL TUNNEL RELEASE Right 2009  .  [facility]  . HIP ARTHROSCOPY W/ LABRAL REPAIR Left 2007  . TRIGGER FINGER RELEASE     thumb    Current Outpatient Prescriptions  Medication Sig Dispense Refill  . Ascorbic Acid (VITAMIN C) 1000 MG tablet Take 1,000 mg by mouth 2 (two) times daily.    Marland Kitchen b complex vitamins tablet Take 1 tablet by mouth daily.    . Calcium Carbonate-Vitamin D (CALCIUM + D PO) Take by mouth.    . cholecalciferol (VITAMIN D) 1000 UNITS tablet Take 4,000 Units by mouth daily.     . diclofenac sodium (VOLTAREN) 1 % GEL Apply 2 g topically 4 (four) times daily. 100 g 3  . glucosamine-chondroitin 500-400 MG tablet Take 1 tablet by mouth 3 (three) times daily.    . Omega-3 Fatty Acids (FISH OIL PO) Take by mouth.    . pyridOXINE (VITAMIN B-6) 100 MG tablet Take 100 mg by mouth daily.    Marland Kitchen Spirulina POWD Take by mouth daily.    . Turmeric Curcumin 500 MG CAPS Take by mouth.     No current facility-administered medications for this visit.      ALLERGIES: Tramadol and Codeine  Family History  Problem Relation Age of Onset  . Osteoporosis Mother   . Hypertension Mother   . Hyperlipidemia Mother   .  Heart failure Mother   . Prostate cancer Father 57       no surgery  . Diabetes Maternal Aunt   . Diabetes Maternal Grandmother   . Heart failure Maternal Grandfather   . Stroke Paternal Grandfather     Social History   Social History  . Marital status: Married    Spouse name: N/A  . Number of children: N/A  . Years of education: N/A   Occupational History  . Not on file.   Social History Main Topics  . Smoking status: Never Smoker  . Smokeless tobacco: Never Used  . Alcohol use Yes  . Drug use: No  . Sexual activity: Not on file   Other Topics Concern  . Not on file   Social History Narrative  . No narrative on file    ROS:  Pertinent items are noted in HPI.  PHYSICAL EXAMINATION:    BP 110/70 (BP Location: Right Arm, Patient Position: Sitting, Cuff Size: Normal)    Pulse 72   Resp 16   Wt 152 lb (68.9 kg)   LMP 11/08/2006 (Approximate)   BMI 24.91 kg/m     General appearance: alert, cooperative and appears stated age  BMD - [facility] 05/14/16: R femur neck: -1.1 L femur total:  -1.3 Spine:  -2.7 ( was -2.2 in 2016)  ASSESSMENT  Osteoporosis.  PLAN  Discussion of osteoporosis, risk factors, fracture risk, assessment through BMD, and tx options.  We reviewed in detail, bisphosphonates po and IV, Prolia, Evista, Forteo.  Risk, benefits, and routes of administration described.  We reviewed reducing risk of falls - shoes, avoiding ladders and activity that can cause sudden fall, using night lights to navigate at night, deicing sidewalks. She will continue weight bearing exercise. Discussed daily calcium and vit D requirements. Will repeat BMD in 1 -2 years. She will call back about her choice of medical therapy for her osteoporosis.  The Evista may be the best fit for her.  Written information given.  She wants to complete her consultation with Dr. Collene Mares about her elevated LFTs first.   An After Visit Summary was printed and given to the patient.  __40____ minutes face to face time of which over 50% was spent in counseling.

## 2016-06-17 ENCOUNTER — Encounter: Payer: Self-pay | Admitting: Obstetrics and Gynecology

## 2016-06-17 ENCOUNTER — Ambulatory Visit (INDEPENDENT_AMBULATORY_CARE_PROVIDER_SITE_OTHER): Payer: BC Managed Care – PPO | Admitting: Obstetrics and Gynecology

## 2016-06-17 VITALS — BP 110/70 | HR 72 | Resp 16 | Wt 152.0 lb

## 2016-06-17 DIAGNOSIS — M810 Age-related osteoporosis without current pathological fracture: Secondary | ICD-10-CM

## 2016-06-17 NOTE — Patient Instructions (Signed)
Raloxifene tablets What is this medicine? RALOXIFENE (ral OX i feen) reduces the amount of calcium lost from bones. It is used to treat and prevent osteoporosis in women who have experienced menopause. It may also help prevent invasive breast cancer in certain women who have a high risk for breast cancer. This medicine may be used for other purposes; ask your health care provider or pharmacist if you have questions. COMMON BRAND NAME(S): Evista What should I tell my health care provider before I take this medicine? They need to know if you have any of these conditions: -a history of blood clots -cancer -heart disease or recent heart attack -high levels of triglycerides (blood fat) in the blood -history of stroke -kidney disease -liver disease -premenopausal -smoke tobacco -an unusual or allergic reaction to raloxifene, other medicines, foods, dyes, or preservatives -pregnant or trying to get pregnant -breast-feeding How should I use this medicine? Take this medicine by mouth with a glass of water. Follow the directions on the prescription label. The tablets can be taken with or without food. Take your doses at regular intervals. Do not take your medicine more often than directed. A special MedGuide will be given to you by the pharmacist with each prescription and refill. Be sure to read this information carefully each time. Talk to your pediatrician regarding the use of this medicine in children. Special care may be needed. Overdosage: If you think you have taken too much of this medicine contact a poison control center or emergency room at once. NOTE: This medicine is only for you. Do not share this medicine with others. What if I miss a dose? If you miss a dose, take it as soon as you can. If it is almost time for your next dose, take only that dose. Do not take double or extra doses. What may interact with this medicine? -cholestyramine -female hormones, like  estrogens -warfarin This list may not describe all possible interactions. Give your health care provider a list of all the medicines, herbs, non-prescription drugs, or dietary supplements you use. Also tell them if you smoke, drink alcohol, or use illegal drugs. Some items may interact with your medicine. What should I watch for while using this medicine? Visit your doctor or health care professional for regular checks on your progress. Do not stop taking this medicine except on the advice of your doctor or health care professional. If you are taking this medicine to reduce your risk of getting breast cancer, you should know that this medicine does not prevent all types of breast cancer. Talk to your doctor if you have questions. This medicine does not prevent hot flashes. It may cause hot flashes in some patients at the start of therapy. You should make sure that you get enough calcium and vitamin D while you are taking this medicine. Discuss the foods you eat and the vitamins you take with your health care professional. Exercise may help to prevent bone loss. Discuss your exercise needs with your doctor or health care professional. This medicine can rarely cause blood clots. If you are going to have surgery, tell your doctor or health care professional that you are taking this medicine. This medicine should be stopped at least 3 days before surgery. After surgery, it should be restarted only after you are walking again. It should not be restarted while you still need long periods of bed rest. You should not smoke while taking this medicine. Smoking may increase your risk of blood clots or stroke.  If you have any reason to think you are pregnant; stop taking this medicine at once and contact your doctor or health care professional. Do not breast feed while taking this medicine. What side effects may I notice from receiving this medicine? Side effects that you should report to your doctor or health  care professional as soon as possible: -allergic reactions like skin rash, itching or hives, swelling of the face, lips, or tongue) -breast tissue changes or discharge -signs and symptoms of a blood clot such as breathing problems; changes in vision; chest pain; severe, sudden headache; pain, swelling, warmth in the leg; trouble speaking; sudden numbness or weakness of the face, arm or leg -signs and symptoms of a stroke like changes in vision; confusion; trouble speaking or understanding; severe headaches; sudden numbness or weakness of the face, arm or leg; trouble walking; dizziness; loss of balance or coordination -vaginal discharge that is bloody, brown, or rust Side effects that usually do not require medical attention (report to your doctor or health care professional if they continue or are bothersome): -hot flashes -joint pain -leg cramps -sweating -swelling of the ankles, feet, hands This list may not describe all possible side effects. Call your doctor for medical advice about side effects. You may report side effects to FDA at 1-800-FDA-1088. Where should I keep my medicine? Keep out of the reach of children. Store at room temperature between 15 and 30 degrees C (59 and 86 degrees F). Throw away any unused medicine after the expiration date. NOTE: This sheet is a summary. It may not cover all possible information. If you have questions about this medicine, talk to your doctor, pharmacist, or health care provider.  2018 Elsevier/Gold Standard (2016-03-02 17:15:34)  

## 2016-08-16 ENCOUNTER — Ambulatory Visit (INDEPENDENT_AMBULATORY_CARE_PROVIDER_SITE_OTHER): Payer: BC Managed Care – PPO | Admitting: Sports Medicine

## 2016-08-16 VITALS — BP 110/62

## 2016-08-16 DIAGNOSIS — M25511 Pain in right shoulder: Secondary | ICD-10-CM

## 2016-08-16 DIAGNOSIS — M8588 Other specified disorders of bone density and structure, other site: Secondary | ICD-10-CM | POA: Diagnosis not present

## 2016-08-16 DIAGNOSIS — M25512 Pain in left shoulder: Secondary | ICD-10-CM

## 2016-08-16 DIAGNOSIS — Z789 Other specified health status: Secondary | ICD-10-CM | POA: Diagnosis not present

## 2016-08-16 DIAGNOSIS — M8580 Other specified disorders of bone density and structure, unspecified site: Secondary | ICD-10-CM | POA: Insufficient documentation

## 2016-08-16 DIAGNOSIS — G8929 Other chronic pain: Secondary | ICD-10-CM

## 2016-08-16 DIAGNOSIS — R768 Other specified abnormal immunological findings in serum: Secondary | ICD-10-CM

## 2016-08-16 NOTE — Assessment & Plan Note (Signed)
While patient has had shoulder and other generalized pain she has generally triggered this with activity  No sign of systemic arthritis on full exam today

## 2016-08-16 NOTE — Progress Notes (Signed)
CC;; More generalized MSK pain  Patient comes in worried about:  Recent evaluation of elevated LFTs GI workup failed to reveal liver disease or specific cause Note Hepatitis screening normal LFTs show relatively mild elevation < 2x norm  However as part of workup + ANA Homogenous pattern 1;320 Scheduled to see rheumatologist  With pattern of pain in shoulders, elbow, hips, knees - ? If this could be related  Has been very good about fitness  Diet review sounds helathy to me - fish but no red meat and lots of vegetables  Prob 2 - Concern for osteoporosis No hx of fracture to suggest this T score on DEXA is primarily abnormal in lumbar spine where it is - 2.7  ROS No joint swelling Denies fever or chills No change in weight  PE Fit appearing F in NAD BP 110/62   LMP 11/08/2006 (Approximate)   Neck exam - mild limitation on rot to RT/ otherwise normal  Shoulders bilat:  Normal ROM/ testong shows good RC strength  Hips - full ROM/  Good strength/ Neg FABER  Knee - some chronic changes as noted before/ good ROM/ no pain w [place]  Ankle exam - unremarkable  Skin - no rash

## 2016-08-16 NOTE — Patient Instructions (Addendum)
Six foods contain Nitric Oxide Kale Collards arugula Red spinach Beets Asparagus  Part of anti-inflammatory diet  Fruits with exception of banannas Vegetables - almost any If 5 to 6 servings per day  Emphasis on higher fiber Less changes of liver toxicity  Lots of water intake daily  Keep up Calcium and Vitamin D  Nutritionist is Iver Nestle  [phone]

## 2016-08-16 NOTE — Assessment & Plan Note (Signed)
I encouraged her to complete workup  Important to see if this has any relationship to her frequent MSK complaints  However I reassured her that she should stay active and exam today was not remarkable fore arthritic change

## 2016-08-16 NOTE — Assessment & Plan Note (Signed)
Reassured that she has not had fractures  Keep up Vit D and Calcium See nutritionist for general advice  I did not recommend medication

## 2016-08-25 ENCOUNTER — Ambulatory Visit (INDEPENDENT_AMBULATORY_CARE_PROVIDER_SITE_OTHER): Payer: BC Managed Care – PPO | Admitting: Family Medicine

## 2016-08-25 DIAGNOSIS — M810 Age-related osteoporosis without current pathological fracture: Secondary | ICD-10-CM

## 2016-08-25 NOTE — Progress Notes (Signed)
Medical Nutrition Therapy:  Appt start time: 8527 end time:  1630.  Assessment:  Primary concerns today: anti-inflammatory diet as well as osteoporosis.   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    She has recently had elevated liver function tests, which have prompted an extensive workup.  These have been inconclusive so far.  Dr. Kathee Delton will be doing a workup Aug 7 for autoimmune disease.  She has had ongoing problems with joint pain.  Currently has bilateral feet and knee pain as well as hip pain.  Has had trigger thumb release and carpal tunnel surgery.  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    Mindy Juarez usually does not usually feel satiated after meals, which is less of a problem following breakfast or lunch b/c of being busy during the day.  Dinner is usually late (8 PM) b/c of when her husband gets home.  Usually drinks wine only on weekends.    Weight has been an issue for Mindy Juarez for most of her life.  She is 6 lb heavier than she would like to be currently.  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  Before dinner is a particularly vulnerable time for bad food choices b/c of being especially hungry.  After dinner is also a time she often struggles with food temptations, especially sweets.     Learning Readiness: Ready  Usual eating pattern includes 3 meals and 2-3 snacks per day. Frequent foods and beverages include green tea, 6 c per day of decaf coffee w/ 1 tbsp crm per cup, almonds &/or cashews, 6-8 dried cherries, and a smoothie at lst 5 days/week (frzn blueberries, str'ber;s, mango, pnapple, 1.5 T hemp seed, 1/2 banana, other fruit, dried coconut, spirulina, carrot, >1 c kale, 2-4 oz yogurt).  Avoided foods include meat (land animals) and beets (disliked).   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  Also usually walks the dog 30-60 min per day.    24-hr recall: (Up at 5:45 AM; rode bike 30-45 min, then 20-min walk with dog; 24 oz water) B (7:15 AM)-   6 dried cherries, green tea B (8:30 AM)-   1/c blueber's, 1.5 T granola, 2 T yogurt, smoothie (see above)  Snk ( AM)-   32 oz decaf coffee, half&half L (12:15 PM)-  1-egg salad sandw in pita, 1/2 t mayo, 1/2 T yogurt, spinach, 2 grp tomatoes, 9 fresh cherries, 1 cucumber, carrots, water Snk (4:15)-  1 nectarine, 1/4 c dry roasted almonds, snack pack Peanut M&Ms, water Snk (5:30)-  1 oz tortilla chips, 1 Ghiradelli square D (7:45 PM)-  pizza zucchini boat w/ mozzarella & veg's (1 whole zucchini), water Snk (8:15)-  1/2 brownie Snk (9:45)-  3-4 handfuls popcorn Typical day? No.  Breakfast was atypical; usually has only the smoothie or smoothie + 1 boiled egg.    Progress Towards Goal(s):  In progress.   Nutritional Diagnosis:  NI-5.7.1 Inadequate protein intake As related to recommended intake of 55 g/day for her wt of 68 kg.  As evidenced by yesterday's intake of <45 grams.      Intervention:  Nutrition education  Handouts given during visit include:  AVS  Handouts on anti-inflammatory diet  Demonstrated degree of understanding via:  Teach Back   Barriers to learning/adherence to lifestyle change: [name] has a longstanding uneasy relationship with food as related to body weight.  She weighs herself daily, and struggles with emotional eating.    Monitoring/Evaluation:  Dietary intake, exercise, and body weight in 4 week(s).

## 2016-08-25 NOTE — Patient Instructions (Addendum)
-   You might benefit from the Living Well: Managing Emotional Eating class that starts on Mon, Aug 6, and meets the 4 Mondays in August.  The class is either from 12-1 PM or 5:30-6:30 PM:  [email or web address]  - Check your calcium supplement:  Calcium-citrate is much better absorbed.   - Look up types of cinnamon to learn about possible liver damage.   - A reliable source of information on supplements and alternative remedies is Jalene Mullet at [facility] [address]).  - Recommendation:  Obtain twice as many veg's as protein or carbohydrate foods for both lunch and dinner. - Studies suggest that inadequate protein is more of a problem for maintaining good bone health than too much protein.  I'd like to see you get at least 20 oz of protein at each meal.    - Each of the following provides 7 g of protein:  1 egg; 1 oz of cheese, most nuts, or fish; 2 tbsp nut butter.  - 1 cup of milk, soy milk, or regular yogurt provides 8 grams of protein; 1 cup of most beans or 4 oz of regular tofu provides 10-12 grams of protein.    - More protein at each meal will also help to stabilize blood sugar levels and appetite.  When you consume a combination of protein with at least a small amount of fat, you more readily stimulate a gut hormone called CCK, sometimes referred to as the satiety hormone.  It helps you feel full and more satisfied.

## 2016-09-08 DIAGNOSIS — M2550 Pain in unspecified joint: Secondary | ICD-10-CM | POA: Insufficient documentation

## 2016-09-08 DIAGNOSIS — R945 Abnormal results of liver function studies: Secondary | ICD-10-CM

## 2016-09-08 DIAGNOSIS — R7989 Other specified abnormal findings of blood chemistry: Secondary | ICD-10-CM | POA: Insufficient documentation

## 2016-09-08 NOTE — Progress Notes (Addendum)
Office Visit Note  Patient: Mindy Juarez             Date of Birth: April 30, 1955           MRN: [id number]             PCP: Mindy Pepper, MD Referring: Mindy Juarez , MD Visit Date: 09/13/2016 Occupation: Academic coach    Subjective:  Elevated LFTs and +ANA.   History of Present Illness: Mindy Juarez is a 61 y.o. female seen in consultation per request of Dr. Collene Mares for evaluation of elevated LFTs and positive ANA. According to patient she's been very active for multiple years. 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 She states she started having joint pain and few years back and had been seeing Dr. Oneida Juarez. She's been told she has osteoarthritis in her knee joint. She also has osteoarthritis in her bilateral feet and has discomfort after prolonged walking. She had left hip joint arthroscopic surgery in 2007 for torn labrum. She is persistent pain in her left hip. She has done physical therapy for that. She recalls at age 32 she had a body cast for hip joint infection. She also gives history of disc disease of C-spine. She has right shoulder joint tendinopathy and before meals joint arthritis per patient. She suffered from left medial epicondylitis. She had surgery for right carpal tunnel release and right thumb trigger finger release. She had been seeing her GYN on a regular basis. She recently went to a physical to her PCP and was found to have elevated LFTs. For that she was referred to Dr. Collene Mares where her LFTs were found to be elevated and  she had positive ANA. She states she drinks only about 4 drinks per week and does not take any anti-inflammatories or Tylenol on regular basis.    Activities of Daily Living:  Patient reports morning stiffness for 15  minutes.   Patient Denies nocturnal pain.  Difficulty dressing/grooming: Reports Difficulty climbing stairs: Reports Difficulty getting out of chair: Reports Difficulty using hands  for taps, buttons, cutlery, and/or writing: Denies   Review of Systems  Constitutional: Positive for fatigue. Negative for night sweats, weight gain, weight loss and weakness.  HENT: Negative for mouth sores, trouble swallowing, trouble swallowing, mouth dryness and nose dryness.   Eyes: Positive for discharge. Negative for pain, redness, visual disturbance and dryness.  Respiratory: Negative.  Negative for cough, shortness of breath and difficulty breathing.   Cardiovascular: Negative.  Negative for chest pain, palpitations, hypertension, irregular heartbeat and swelling in legs/feet.  Gastrointestinal: Negative.  Negative for blood in stool, constipation and diarrhea.  Endocrine: Negative.  Negative for increased urination.  Genitourinary: Negative.  Negative for nocturia and vaginal dryness.  Musculoskeletal: Positive for arthralgias, joint pain, myalgias (fib), morning stiffness and myalgias (fib). Negative for joint swelling, muscle weakness and muscle tenderness.  Skin: Positive for color change and sensitivity to sunlight. Negative for rash, hair loss, skin tightness and ulcers.  Allergic/Immunologic: Negative for susceptible to infections.  Neurological: Negative.  Negative for dizziness, headaches, memory loss and night sweats.  Hematological: Negative.  Negative for swollen glands.  Psychiatric/Behavioral: Negative.  Negative for depressed mood and sleep disturbance. The patient is not nervous/anxious.     PMFS History:  Patient Active Problem List   Diagnosis Date Noted  . Elevated LFTs/ has been evaluated by Dr Collene Mares GI  09/08/2016  . Multiple joint pain 09/08/2016  . ANA  positive 08/16/2016  . Osteopenia 08/16/2016  . Acute pain of left foot 01/21/2015  . Left hip pain 03/06/2014  . Shoulder pain, bilateral 03/06/2014  . Pain in joint, ankle and foot 08/15/2013  . Right elbow pain 06/04/2013  . Medial epicondylitis of right elbow 06/04/2013  . Knee pain 06/05/2012  .  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 Elevated liver enzymes 2018  . Osteoporosis 05/16/2016    Family History  Problem Relation Age of Onset  . Osteoporosis Mother   . Hypertension Mother   . Hyperlipidemia Mother   . Heart failure Mother   . Prostate cancer Father 61       no surgery  . Diabetes Maternal Aunt   . Diabetes Maternal Grandmother   . Heart failure Maternal Grandfather   . Stroke Paternal Grandfather    Past Surgical History:  Procedure Laterality Date  . CARPAL TUNNEL RELEASE Right 2009  . [place]  . HIP ARTHROSCOPY W/ LABRAL REPAIR Left 2007  . TRIGGER FINGER RELEASE     thumb   Social History   Social History Narrative  . No narrative on file     Objective: Vital Signs: BP 129/69   Pulse 62   Ht 5\' 6"  (1.676 m)   Wt 155 lb (70.3 kg)   LMP 11/08/2006 (Approximate)   BMI 25.02 kg/m    Physical Exam  Constitutional: She is oriented to person, place, and time. She appears well-developed and well-nourished.  HENT:  Head: Normocephalic and atraumatic.  Eyes: Conjunctivae and EOM are normal.  Neck: Normal range of motion.  Cardiovascular: Normal rate, regular rhythm, normal heart sounds and intact distal pulses.   Pulmonary/Chest: Effort normal and breath sounds normal.  Abdominal: Soft. Bowel sounds are normal.  Lymphadenopathy:    She has no cervical adenopathy.  Neurological: She is alert and oriented to person, place, and time.  Skin: Skin is warm and dry. Capillary refill takes 2 to 3 seconds.  Psychiatric: She has a normal mood and affect. Her behavior is normal.  Nursing note and vitals reviewed.    Musculoskeletal Exam: C-spine and thoracic lumbar spine good range of motion. Shoulder joints elbow joints wrist joint MCPs were good range of motion. She has some tenderness over left medial epicondyle area consistent with medial epicondylitis. She has DIP PIP thickening of her  hands consistent with osteoarthritis. I did not see any synovitis on examination. Hip joints are good range of motion. She has warmth swelling and small effusion in her right knee joint. Ankle joints are good range of motion. She has bilateral dorsal spur and tenderness across her MTP joints with no synovitis was noted.  CDAI Exam: No CDAI exam completed.    Investigation: Findings:  07/05/2016 ANA positive 1:320 titer Homogeneous pattern. Actin (smooth muscle Ab) 26 ^, Ferritin 31, Hep A Ab IgM negative, Hep B Core Ab IgM  negative,  Hep B surface Ab Qual reactive(c/w immunity) , Heb C Virus Ab negative, and GGT elevated 69     Imaging: Xr Foot 2 Views Left  Result Date: 09/13/2016 First MTP of PIP/DIP narrowing consistent with osteoarthritis. No intertarsal joint space narrowing was noted. Dorsal spur was given. Small posterior calcaneal spur was noted. Impression: These findings are consistent with osteoarthritis  Xr Foot 2 Views Right  Result Date: 09/13/2016 First MTP severe narrowing with spurring was noted. All PIP/DIP narrowing was noted. No intertarsal joint space  narrowing was noted. Dorsal spurring was noted. Impression: These findings are consistent with osteoarthritis of the foot.  Xr Hand 2 View Left  Result Date: 09/13/2016 PIP/DIP narrowing was noted. No significant MCP joint narrowing was noted. No intercarpal joint space narrowing was noted. No erosive changes were noted. Impression: These findings are consistent with osteoarthritis of the hand  Xr Hand 2 View Right  Result Date: 09/13/2016 PIP/DIP narrowing was noted. No significant MCP joint narrowing was noted. No intercarpal joint space narrowing was noted. No erosive changes were noted. Impression: These findings are consistent with osteoarthritis of the hand  Xr Knee 3 View Left  Result Date: 09/13/2016 Moderate medial compartment narrowing was noted. No chondrocalcinosis was noted. Severe patellofemoral narrowing was  noted. Impression: These findings are consistent with moderate osteoarthritis and severe chondromalacia patella.  Xr Knee 3 View Right  Result Date: 09/13/2016 Moderate medial compartment narrowing with medial osteophytes intercondylar osteophytes was noted. No chondrocalcinosis was noted. Severe patellofemoral narrowing was noted. Impression: Moderate osteoarthritis and severe chondromalacia patella   Speciality Comments: No specialty comments available.    Procedures:  No procedures performed Allergies: Tramadol and Codeine   Assessment / Plan:     Visit Diagnoses: ANA positive -patient gives history of Raynaud's phenomenon and photosensitivity. I will obtain following labs to evaluate this further. Plan: ANA, C3 and C4, Beta-2 glycoprotein antibodies, Lupus anticoagulant panel, Cardiolipin antibodies, IgG, IgM, IgA, [id number] ENA PANEL  Raynaud's syndrome without gangrene: She complains of Raynauds more during the wintertime.  Other fatigue - Plan: CBC with Differential/Platelet, COMPLETE METABOLIC PANEL WITH GFR, Urinalysis, Routine w reflex microscopic, CK, TSH, Serum protein electrophoresis with reflex  Pain in both hands, she has DIP PIP thickening but no synovitis on examination.- Plan: XR Hand 2 View Right, XR Hand 2 View Left, Sedimentation rate, Rheumatoid factor, Cyclic citrul peptide antibody, IgG  Chronic pain of both knees. She has warmth swelling and a small effusion in her right knee joint. - Plan: XR KNEE 3 VIEW RIGHT, XR KNEE 3 VIEW LEFT, Uric acid  Pain in both feet. Patient gives history of cushion about gout in the past but never been treated. Clinical findings are consistent with osteoarthritis. We will - Plan: XR Foot 2 Views Right, XR Foot 2 Views Left  Elevated LFTs/ has been evaluated by Dr Collene Mares GI . Patient reports that she drinks only 4 alcoholic beverages a week and does not take any anti-inflammatories on regular basis.  Multiple joint  pain  Patellofemoral arthritis Right knee  Age-related osteoporosis without current pathological fracture. Patient prefers not to take any treatment for osteoporosis. She states she's taking calcium and vitamin D. - Plan: VITAMIN D 25 Hydroxy (Vit-D Deficiency, Fractures)    Orders: Orders Placed This Encounter  Procedures  . XR Hand 2 View Right  . XR Hand 2 View Left  . XR KNEE 3 VIEW RIGHT  . XR KNEE 3 VIEW LEFT  . XR Foot 2 Views Right  . XR Foot 2 Views Left  . CBC with Differential/Platelet  . COMPLETE METABOLIC PANEL WITH GFR  . Urinalysis, Routine w reflex microscopic  . Sedimentation rate  . CK  . TSH  . ANA  . Rheumatoid factor  . Cyclic citrul peptide antibody, IgG  . Uric acid  . Serum protein electrophoresis with reflex  . C3 and C4  . Beta-2 glycoprotein antibodies  . Lupus anticoagulant panel  . Cardiolipin antibodies, IgG, IgM, IgA  . [id number] ENA PANEL  .  VITAMIN D 25 Hydroxy (Vit-D Deficiency, Fractures)   No orders of the defined types were placed in this encounter.   Face-to-face time spent with patient was 50 minutes. greater than 50% of time was spent in counseling and coordination of care.  Follow-Up Instructions: Return for Positive ANA, elevated LFTs, osteoarthritis, osteoporosis.   Bo Merino, MD  Note - This record has been created using Editor, commissioning.  Chart creation errors have been sought, but may not always  have been located. Such creation errors do not reflect on  the standard of medical care.

## 2016-09-12 ENCOUNTER — Ambulatory Visit: Payer: BC Managed Care – PPO | Admitting: Rheumatology

## 2016-09-13 ENCOUNTER — Ambulatory Visit (INDEPENDENT_AMBULATORY_CARE_PROVIDER_SITE_OTHER): Payer: Self-pay

## 2016-09-13 ENCOUNTER — Ambulatory Visit (INDEPENDENT_AMBULATORY_CARE_PROVIDER_SITE_OTHER): Payer: BC Managed Care – PPO | Admitting: Rheumatology

## 2016-09-13 ENCOUNTER — Encounter: Payer: Self-pay | Admitting: Rheumatology

## 2016-09-13 VITALS — BP 129/69 | HR 62 | Ht 66.0 in | Wt 155.0 lb

## 2016-09-13 DIAGNOSIS — M1710 Unilateral primary osteoarthritis, unspecified knee: Secondary | ICD-10-CM | POA: Diagnosis not present

## 2016-09-13 DIAGNOSIS — R7989 Other specified abnormal findings of blood chemistry: Secondary | ICD-10-CM | POA: Diagnosis not present

## 2016-09-13 DIAGNOSIS — M2550 Pain in unspecified joint: Secondary | ICD-10-CM | POA: Diagnosis not present

## 2016-09-13 DIAGNOSIS — M25562 Pain in left knee: Secondary | ICD-10-CM | POA: Diagnosis not present

## 2016-09-13 DIAGNOSIS — M25561 Pain in right knee: Secondary | ICD-10-CM

## 2016-09-13 DIAGNOSIS — I7300 Raynaud's syndrome without gangrene: Secondary | ICD-10-CM | POA: Diagnosis not present

## 2016-09-13 DIAGNOSIS — M79671 Pain in right foot: Secondary | ICD-10-CM

## 2016-09-13 DIAGNOSIS — G8929 Other chronic pain: Secondary | ICD-10-CM

## 2016-09-13 DIAGNOSIS — M79641 Pain in right hand: Secondary | ICD-10-CM | POA: Diagnosis not present

## 2016-09-13 DIAGNOSIS — M79642 Pain in left hand: Secondary | ICD-10-CM | POA: Diagnosis not present

## 2016-09-13 DIAGNOSIS — M810 Age-related osteoporosis without current pathological fracture: Secondary | ICD-10-CM | POA: Diagnosis not present

## 2016-09-13 DIAGNOSIS — M79672 Pain in left foot: Secondary | ICD-10-CM

## 2016-09-13 DIAGNOSIS — R768 Other specified abnormal immunological findings in serum: Secondary | ICD-10-CM

## 2016-09-13 DIAGNOSIS — R945 Abnormal results of liver function studies: Secondary | ICD-10-CM

## 2016-09-13 DIAGNOSIS — R5383 Other fatigue: Secondary | ICD-10-CM

## 2016-09-13 LAB — COMPLETE METABOLIC PANEL WITH GFR
ALK PHOS: 87 U/L (ref 33–130)
ALT: 42 U/L — ABNORMAL HIGH (ref 6–29)
AST: 35 U/L (ref 10–35)
Albumin: 4.5 g/dL (ref 3.6–5.1)
BILIRUBIN TOTAL: 0.6 mg/dL (ref 0.2–1.2)
BUN: 14 mg/dL (ref 7–25)
CO2: 24 mmol/L (ref 20–32)
CREATININE: 0.64 mg/dL (ref 0.50–0.99)
Calcium: 9.9 mg/dL (ref 8.6–10.4)
Chloride: 104 mmol/L (ref 98–110)
GFR, Est African American: >89 mL/min (ref >=60)
GFR, Est Non African American: >89 mL/min (ref >=60)
GLUCOSE: 93 mg/dL (ref 65–99)
Potassium: 4.3 mmol/L (ref 3.5–5.3)
SODIUM: 140 mmol/L (ref 135–146)
TOTAL PROTEIN: 7.3 g/dL (ref 6.1–8.1)

## 2016-09-13 LAB — CBC WITH DIFFERENTIAL/PLATELET
Basophils Absolute: 0 cells/uL (ref 0–200)
Basophils Relative: 0 %
EOS PCT: 3 %
Eosinophils Absolute: 150 cells/uL (ref 15–500)
HCT: 39.5 % (ref 35.0–45.0)
Hemoglobin: 13.1 g/dL (ref 11.7–15.5)
LYMPHS ABS: 1150 {cells}/uL (ref 850–3900)
Lymphocytes Relative: 23 %
MCH: 30.6 pg (ref 27.0–33.0)
MCHC: 33.2 g/dL (ref 32.0–36.0)
MCV: 92.3 fL (ref 80.0–100.0)
MPV: 10.7 fL (ref 7.5–12.5)
Monocytes Absolute: 300 cells/uL (ref 200–950)
Monocytes Relative: 6 %
NEUTROS ABS: 3400 {cells}/uL (ref 1500–7800)
Neutrophils Relative %: 68 %
Platelets: 224 10*3/uL (ref 140–400)
RBC: 4.28 MIL/uL (ref 3.80–5.10)
RDW: 13.8 % (ref 11.0–15.0)
WBC: 5 10*3/uL (ref 3.8–10.8)

## 2016-09-13 LAB — TSH: TSH: 2.54 mIU/L

## 2016-09-13 NOTE — Patient Instructions (Signed)
Natural anti-inflammatories  You can purchase these at [employer], [employer] or online.  . Turmeric (capsules)  . Ginger (ginger root or capsules)  . Omega 3 (Fish, flax seeds, chia seeds, walnuts, almonds)  . Tart cherry (dried or extract)   Patient should be under the care of a physician while taking these supplements. This may not be reproduced without the permission of Dr. Catlin Aycock.  

## 2016-09-14 LAB — CK: Total CK: 153 U/L — ABNORMAL HIGH (ref 29–143)

## 2016-09-14 LAB — URINALYSIS, ROUTINE W REFLEX MICROSCOPIC
BILIRUBIN URINE: NEGATIVE
Glucose, UA: NEGATIVE
HGB URINE DIPSTICK: NEGATIVE
Ketones, ur: NEGATIVE
Leukocytes, UA: NEGATIVE
Nitrite: NEGATIVE
PROTEIN: NEGATIVE
Specific Gravity, Urine: 1.006 (ref 1.001–1.035)
pH: 7.5 (ref 5.0–8.0)

## 2016-09-14 LAB — SEDIMENTATION RATE: Sed Rate: 5 mm/hr (ref 0–30)

## 2016-09-14 LAB — C3 AND C4
C3 Complement: 124 mg/dL (ref 83–193)
C4 Complement: 26 mg/dL (ref 15–57)

## 2016-09-14 LAB — CARDIOLIPIN ANTIBODIES, IGG, IGM, IGA: Anticardiolipin IgM: <12 [MPL'U]

## 2016-09-14 LAB — CYCLIC CITRUL PEPTIDE ANTIBODY, IGG: Cyclic Citrullin Peptide Ab: <16 Units

## 2016-09-14 LAB — ANTI-NUCLEAR AB-TITER (ANA TITER)

## 2016-09-14 LAB — URIC ACID: Uric Acid, Serum: 3.5 mg/dL (ref 2.5–7.0)

## 2016-09-14 LAB — ANA: Anti Nuclear Antibody(ANA): POSITIVE — AB

## 2016-09-14 LAB — VITAMIN D 25 HYDROXY (VIT D DEFICIENCY, FRACTURES): VIT D 25 HYDROXY: 61 ng/mL (ref 30–100)

## 2016-09-14 LAB — RHEUMATOID FACTOR: Rhuematoid fact SerPl-aCnc: <14 IU/mL (ref <14)

## 2016-09-15 LAB — CP5000020 ENA PANEL
ENA SM AB SER-ACNC: NEGATIVE
Ribonucleic Protein(ENA) Antibody, IgG: <1
SCLERODERMA (SCL-70) (ENA) ANTIBODY, IGG: NEGATIVE
SSA (Ro) (ENA) Antibody, IgG: <1
SSB (La) (ENA) Antibody, IgG: <1
ds DNA Ab: <1 IU/mL

## 2016-09-15 LAB — BETA-2 GLYCOPROTEIN ANTIBODIES: Beta-2 Glyco I IgG: <9 SGU (ref <=20)

## 2016-09-16 LAB — RFX DRVVT SCR W/RFLX CONF 1:1 MIX: DRVVT SCREEN: 31 s (ref <=45)

## 2016-09-16 LAB — PROTEIN ELECTROPHORESIS, SERUM, WITH REFLEX
ALBUMIN ELP: 4.5 g/dL (ref 3.8–4.8)
ALPHA-1-GLOBULIN: 0.3 g/dL (ref 0.2–0.3)
ALPHA-2-GLOBULIN: 0.7 g/dL (ref 0.5–0.9)
BETA GLOBULIN: 0.4 g/dL (ref 0.4–0.6)
Beta 2: 0.3 g/dL (ref 0.2–0.5)
Gamma Globulin: 1.4 g/dL (ref 0.8–1.7)
TOTAL PROTEIN, SERUM ELECTROPHOR: 7.6 g/dL (ref 6.1–8.1)

## 2016-09-16 LAB — LUPUS ANTICOAGULANT PANEL

## 2016-09-16 LAB — RFX PTT-LA W/RFX TO HEX PHASE CONF: PTT-LA Screen: 35 s (ref <=40)

## 2016-09-17 NOTE — Progress Notes (Signed)
ANA positive, all other Abs are negative. ALT still elevated at 42 but AST is normal. We can schedule [place] of bilateral hands to r/o synovitis if she is still having hand pain. Please, fax results to Dr. Collene Mares.

## 2016-09-22 ENCOUNTER — Ambulatory Visit (INDEPENDENT_AMBULATORY_CARE_PROVIDER_SITE_OTHER): Payer: BC Managed Care – PPO | Admitting: Family Medicine

## 2016-09-22 ENCOUNTER — Encounter: Payer: Self-pay | Admitting: Family Medicine

## 2016-09-22 DIAGNOSIS — M810 Age-related osteoporosis without current pathological fracture: Secondary | ICD-10-CM

## 2016-09-22 NOTE — Progress Notes (Signed)
Medical Nutrition Therapy:  Appt start time: 1430 end time:  4562.  Assessment:  Primary concerns today: anti-inflammatory diet as well as osteoporosis.   Mindy Juarez consulted Mindy Juarez at [facility], who gave her a lot of good info on supplements and calcium supplements in particular.  She is now trying to keep her Ca supplement down to <500 mg at a time, and total Ca intake at no more than 1200/day.  For this reason, she tracked Ca intake daily for two weeks.  She has been getting 700 to 1000 mg of dietary Ca, averaging ~900 mg/day.  She has made an effort to eat more yogurt, and is getting at least 6 oz of yogurt 1-2 X daily.    Mindy Juarez has also tracked protein intake, which has averaged at least 70 grams/day (range of 53 to 91 g), although this is not reflected in yesterday's intake.  She has been eating chx once a week, and is taking 1/2 almond butter (2 tbsp) sandwich for lunch most days.  Had fish 3 X wk last week.    She is also going to look into working with Pilates exercise with PT Mindy Juarez.    Mindy Juarez is still interested in learning more about managing emotional eating.  She seems to be especially vulnerable to food choices she later regrets, such as sweets, by late-afternoon on most days, citing what sound like hypoglycemic symptoms.    24-hr recall suggests an intake of ~50 g protein:  (Up at 6 AM) 45-min bike ride at 7:20; walked dog 18 min  B (9 AM)-  1 c grn tea, 1 boiled egg, green smoothie (4 oz Fage yogurt, 1/3 c o.j., frzn blueber's, str'ber's, 1 clementine, 1.5 T hemp seed, 1/2 banana, spirulina, 1 c kale) Snk (10 AM)-  Decaf coffee, cream L (12 PM)-  3/4 c spinach-orzo salad, 6 corn chips, 2-3 tbsp bean dip, 1 baby carrot, 1 rice krispie treat, water Snk (4 PM)-  1/4 cashews, water D (8 PM)-  1 c lentil-rice-veg casserole, 1 1/2 c watermelon, water Snk (8:40)-  ?? amount animal crackers Typical day? No.  Yesterday included a pot luck lunch at work.  Breakfast and  dinner were typical.    Progress Towards Goal(s):  In progress.   Nutritional Diagnosis:  NI-5.7.1 Inadequate protein intake As related to recommended intake of 55 g/day for her wt of 68 kg.  As evidenced by yesterday's intake of <45 grams.      Intervention:  Nutrition education  Handouts given during visit include:  AVS  Handouts on anti-inflammatory diet  Demonstrated degree of understanding via:  Teach Back   Barriers to learning/adherence to lifestyle change: [name] has a longstanding uneasy relationship with food as related to body weight.  She weighs herself daily, and struggles with emotional eating.    Monitoring/Evaluation:  Dietary intake, exercise, and body weight in 6 week(s).

## 2016-09-22 NOTE — Patient Instructions (Addendum)
-   Weight gain you've noticed in the past couple of weeks:    - Take note if you notice any swelling in fingers/hands:  Does this correlate with eating out more, i.e., sodium intake)?  - Aim for exercise consistency.    - Make sure you PREVENT getting over-hungry by eating an adequate lunch, including some fruit or other carb source!    - Nuts for snacks:  PLATE all your food before eating.  You may want to portion out some of the nuts for snacks.    - Mindful eating (e.g., yesterday's animal crackers): See recommendation above!  - Plan meals and snacks you think will be satisfying, give yourself permission to eat, and eat with full attention!  - AT FOLLOW-UP, [place].

## 2016-10-11 ENCOUNTER — Encounter: Payer: Self-pay | Admitting: Family Medicine

## 2016-10-11 ENCOUNTER — Ambulatory Visit (INDEPENDENT_AMBULATORY_CARE_PROVIDER_SITE_OTHER): Payer: BC Managed Care – PPO | Admitting: Family Medicine

## 2016-10-11 DIAGNOSIS — M810 Age-related osteoporosis without current pathological fracture: Secondary | ICD-10-CM | POA: Diagnosis not present

## 2016-10-11 NOTE — Progress Notes (Signed)
Medical Nutrition Therapy:  Appt start time: 1430 end time:  8676.  Assessment:  Primary concerns today: anti-inflammatory diet as well as osteoporosis.   Mindy Juarez is unhappy that her weight has continued to rise.  Last weekend she was camping with friends, so was pretty active, and not eating a lot of food.  Her weight was up to 155 lb this morning the weekend, even though she doesn't feel she overate.  [name] has noticed some swelling in her hands; could not get ring off last weekend.  [name] thinks she is pretty disciplined at mealtimes, but suspects snack times are problematic for eating too much or the wrong foods.  Revecca said she is almost always hungry once she gets home, even at times when she notices no hunger as she pulls into the driveway.    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  Also sometimes does activity on Mondays (cardio workout at home or gym), and most Sundays, she hikes with husband and dog ~1 hr.  Also walks dog at least 15 min daily in AM, and up to an hour in the afternoon.      Progress Towards Goal(s):  In progress.   Nutritional Diagnosis from 8/16 (no recall done today):  NI-5.7.1 Inadequate protein intake As related to recommended intake of 55 g/day for her wt of 68 kg.  As evidenced by yesterday's intake of <45 grams.      Intervention:  Nutrition education  Handouts given during visit include:  AVS  Demonstrated degree of understanding via:  Teach Back   Barriers to learning/adherence to lifestyle change: Krithika has a longstanding uneasy relationship with food as related to body weight.  She weighs herself daily, and struggles with emotional eating.    Monitoring/Evaluation:  Dietary intake, exercise, and body weight in 8 week(s).

## 2016-10-11 NOTE — Patient Instructions (Addendum)
-   Potential issues around food choices for you:  1. Fast eating.   2. Perception that certain foods are off-limits.   3. Getting over-hungry.   4. Habitual eating.   Dealing with emotional eating: 1. Delay (no urge lasts forever). 2. Distract; make a list of possible activities, and keep in a place where you have ready access.   3. Distance yourself from food triggers and from the source of foods you are craving.  Change your environment.   AND: 4. Determine:  What's going on?  What led to my wanting to eat or to eat this specific food? 5. Be DELIBERATE in using the above tools.  Practice, practice, practice!  Use the Food Decisions Algorithm provided today.    - Mindfulness is an important part of managing appetite.   - Remember that satisfaction from food is also a very important part of appetite management.  Each meal should look like, taste like, and feel like a REAL meal.    - Also keep in mind the concept of food habituation, which means when food is still novel, we are MUCH more interested in it, so giving yourself permission to have some sweets now and then may make them less "novel."               - At the same time, you can work on modifying your taste preferences, like SLOWLY reducing the amount of marshmallow creme with animal crackers.  The key to changing taste preferences is to go slowly and to be consistent.

## 2016-10-11 NOTE — Progress Notes (Signed)
Office Visit Note  Patient: Mindy Juarez             Date of Birth: April 05, 1955           MRN: [id number]             PCP: London Pepper, MD Referring: Maurice Small, MD Visit Date: 10/13/2016 Occupation: _0 @    Subjective:  Raynaud's.   History of Present Illness: Mindy Juarez is a 61 y.o. female with history of positive ANA and osteoarthritis. She states she continues to have mild symptoms of Raynaud's which are tolerable. She denies any joint pain or joint swelling. She does have osteoarthritis which causes joint stiffness. For her right knee joint arthritis she sees Dr. Oneida Alar and is been using some compression brace which is been helpful. She denies any history of mucosal ulcers or malar rash.  Activities of Daily Living:   Patient reports morning stiffness until she moves.   Patient Denies nocturnal pain.  Difficulty dressing/grooming: Denies Difficulty climbing stairs: Reports Difficulty getting out of chair: Denies Difficulty using hands for taps, buttons, cutlery, and/or writing: Denies   Review of Systems  Constitutional: Negative.  Negative for fatigue, night sweats, weight gain, weight loss and weakness.  HENT: Negative.  Negative for mouth sores, trouble swallowing, trouble swallowing, mouth dryness and nose dryness.   Eyes: Negative.  Negative for pain, redness, visual disturbance and dryness.  Respiratory: Negative.  Negative for cough, shortness of breath and difficulty breathing.   Cardiovascular: Negative.  Negative for chest pain, palpitations, hypertension, irregular heartbeat and swelling in legs/feet.  Gastrointestinal: Negative.  Negative for blood in stool, constipation and diarrhea.  Endocrine: Negative.  Negative for increased urination.  Genitourinary: Negative.  Negative for vaginal dryness.  Musculoskeletal: Positive for arthralgias, joint pain and morning stiffness. Negative for joint swelling, myalgias, muscle weakness, muscle tenderness  and myalgias.       Right shoulder   Skin: Positive for color change. Negative for rash, hair loss, skin tightness, ulcers and sensitivity to sunlight.  Allergic/Immunologic: Negative for susceptible to infections.  Neurological: Negative.  Negative for dizziness, memory loss and night sweats.  Hematological: Negative.  Negative for swollen glands.  Psychiatric/Behavioral: Negative.  Negative for depressed mood and sleep disturbance. The patient is not nervous/anxious.     PMFS History:  Patient Active Problem List   Diagnosis Date Noted  . Elevated LFTs/ has been evaluated by Dr Collene Mares GI  09/08/2016  . Multiple joint pain 09/08/2016  . ANA positive 08/16/2016  . Osteopenia 08/16/2016  . Acute pain of left foot 01/21/2015  . Left hip pain 03/06/2014  . Shoulder pain, bilateral 03/06/2014  . Pain in joint, ankle and foot 08/15/2013  . Right elbow pain 06/04/2013  . Medial epicondylitis of right elbow 06/04/2013  . Knee pain 06/05/2012  . 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 Elevated liver enzymes 2018  . Osteoporosis 05/16/2016    Family History  Problem Relation Age of Onset  . Osteoporosis Mother   . Hypertension Mother   . Hyperlipidemia Mother   . Heart failure Mother   . Prostate cancer Father 57       no surgery  . Diabetes Maternal Aunt   . Diabetes Maternal Grandmother   . Heart failure Maternal Grandfather   . Stroke Paternal Grandfather    Past Surgical History:  Procedure Laterality Date  . CARPAL TUNNEL RELEASE Right 2009  . CESAREAN SECTION  [place]  . HIP ARTHROSCOPY W/ LABRAL REPAIR Left 2007  . TRIGGER FINGER RELEASE     thumb   Social History   Social History Narrative  . No narrative on file     Objective: Vital Signs: BP 118/68   Pulse 78   Resp 16   Ht '5\' 6"'$  (1.676 m)   Wt 152 lb (68.9 kg)   LMP 11/08/2006 (Approximate)   BMI 24.53 kg/m    Physical Exam  Constitutional: She is oriented  to person, place, and time. She appears well-developed and well-nourished.  HENT:  Head: Normocephalic and atraumatic.  Eyes: Conjunctivae and EOM are normal.  Neck: Normal range of motion.  Cardiovascular: Normal rate, regular rhythm, normal heart sounds and intact distal pulses.   Pulmonary/Chest: Effort normal and breath sounds normal.  Abdominal: Soft. Bowel sounds are normal.  Lymphadenopathy:    She has no cervical adenopathy.  Neurological: She is alert and oriented to person, place, and time.  Skin: Skin is warm and dry. Capillary refill takes less than 2 seconds.  Psychiatric: She has a normal mood and affect. Her behavior is normal.  Nursing note and vitals reviewed.    Musculoskeletal Exam: C-spine and thoracic lumbar spine good range of motion. Shoulder joints although joints wrist joint MCPs PIPs DIPs are good range of motion. She is some thickening of PIP/DIP joints in her feet consistent with osteoarthritis. She is some crepitus in her bilateral knee joints and warmth in her right knee joint.  CDAI Exam: No CDAI exam completed.    Investigation: No additional findings. CBC Latest Ref Rng & Units 09/13/2016 06/10/2016 05/29/2015  WBC 3.8 - 10.8 K/uL 5.0 4.6 4.2  Hemoglobin 11.7 - 15.5 g/dL 13.1 12.5 12.5  Hematocrit 35.0 - 45.0 % 39.5 37.4 38.7  Platelets 140 - 400 K/uL 224 210 228   CMP     Component Value Date/Time   NA 140 09/13/2016 1015   K 4.3 09/13/2016 1015   CL 104 09/13/2016 1015   CO2 24 09/13/2016 1015   GLUCOSE 93 09/13/2016 1015   BUN 14 09/13/2016 1015   CREATININE 0.64 09/13/2016 1015   CALCIUM 9.9 09/13/2016 1015   PROT 7.3 09/13/2016 1015   ALBUMIN 4.5 09/13/2016 1015   AST 35 09/13/2016 1015   ALT 42 (H) 09/13/2016 1015   ALKPHOS 87 09/13/2016 1015   BILITOT 0.6 09/13/2016 1015   GFRNONAA >89 09/13/2016 1015   GFRAA >89 09/13/2016 1015   08/07/ 2018 UA negative, ANA 1:320NH,ENA negative, beta 2 negative, anticardiolipin negative,  Lupus  anticoagulant negative, C3-C4 normal, RF negative, anti-CCP negative, uric acid 3.5, ESR 5, CK 153 mildly elevated, TSH normal, vitamin D 61  SPEP negative Imaging: No results found.  Speciality Comments: No specialty comments available.    Procedures:  No procedures performed Allergies: Tramadol and Codeine   Assessment / Plan:     Visit Diagnoses: ANA positive -  ANA 1:320NH, all other autoimmune workup negative., History of Raynaud's phenomenon and photosensitivity.. She has no other clinical features of autoimmune disease. At this point her Raynauds is not very active. We will continue to monitor her. I've advised her to contact me if she develops any new symptoms. Detailed counseling was provided.  Elevated CK - mild, can be repeated in future.  Primary osteoarthritis of both hands: Joint protection and muscle strengthening and natural anti-inflammatories were discussed at length.  Primary osteoarthritis of both feet: Proper fitting shoes were discussed.  Primary osteoarthritis  of both knees - moderate osteoarthritis, severe chondromalacia patella. She's been followed by Dr. Oneida Alar and trying to use a compression brace on her right knee joint. She is quite satisfied with the treatment.  Elevated LFTs/ has been evaluated by Dr Collene Mares GI     Orders: No orders of the defined types were placed in this encounter.  No orders of the defined types were placed in this encounter.   Face-to-face time spent with patient was  minutes. Greater than 50% of time was spent in counseling and coordination of care.  Follow-Up Instructions: Return in about 1 year (around 10/13/2017) for Raynaud's +ANA.   Bo Merino, MD  Note - This record has been created using Editor, commissioning.  Chart creation errors have been sought, but may not always  have been located. Such creation errors do not reflect on  the standard of medical care.

## 2016-10-13 ENCOUNTER — Encounter: Payer: Self-pay | Admitting: Rheumatology

## 2016-10-13 ENCOUNTER — Ambulatory Visit (INDEPENDENT_AMBULATORY_CARE_PROVIDER_SITE_OTHER): Payer: BC Managed Care – PPO | Admitting: Rheumatology

## 2016-10-13 VITALS — BP 118/68 | HR 78 | Resp 16 | Ht 66.0 in | Wt 152.0 lb

## 2016-10-13 DIAGNOSIS — R7989 Other specified abnormal findings of blood chemistry: Secondary | ICD-10-CM

## 2016-10-13 DIAGNOSIS — M19042 Primary osteoarthritis, left hand: Secondary | ICD-10-CM

## 2016-10-13 DIAGNOSIS — M19071 Primary osteoarthritis, right ankle and foot: Secondary | ICD-10-CM | POA: Diagnosis not present

## 2016-10-13 DIAGNOSIS — M170 Bilateral primary osteoarthritis of knee: Secondary | ICD-10-CM

## 2016-10-13 DIAGNOSIS — R748 Abnormal levels of other serum enzymes: Secondary | ICD-10-CM

## 2016-10-13 DIAGNOSIS — R768 Other specified abnormal immunological findings in serum: Secondary | ICD-10-CM | POA: Diagnosis not present

## 2016-10-13 DIAGNOSIS — M19041 Primary osteoarthritis, right hand: Secondary | ICD-10-CM

## 2016-10-13 DIAGNOSIS — M19072 Primary osteoarthritis, left ankle and foot: Secondary | ICD-10-CM

## 2016-10-13 DIAGNOSIS — R945 Abnormal results of liver function studies: Secondary | ICD-10-CM

## 2016-11-02 ENCOUNTER — Telehealth: Payer: Self-pay | Admitting: *Deleted

## 2016-11-02 NOTE — Telephone Encounter (Signed)
NOTES SENT TO SCHEDULING.  °

## 2016-11-30 ENCOUNTER — Encounter: Payer: Self-pay | Admitting: Physician Assistant

## 2016-11-30 ENCOUNTER — Ambulatory Visit (INDEPENDENT_AMBULATORY_CARE_PROVIDER_SITE_OTHER): Payer: BC Managed Care – PPO | Admitting: Physician Assistant

## 2016-11-30 VITALS — BP 138/70 | HR 52 | Ht 66.0 in | Wt 158.8 lb

## 2016-11-30 DIAGNOSIS — Z8249 Family history of ischemic heart disease and other diseases of the circulatory system: Secondary | ICD-10-CM | POA: Diagnosis not present

## 2016-11-30 DIAGNOSIS — R0602 Shortness of breath: Secondary | ICD-10-CM | POA: Diagnosis not present

## 2016-11-30 NOTE — Progress Notes (Addendum)
Cardiology Office Note:    Date:  11/30/2016   ID:  Mindy Juarez, DOB 03-01-1955, MRN [id number]  PCP:  Maurice Small, MD  Cardiologist:  New - Dr. Fransico Him   Referring MD: Maurice Small, MD   Chief Complaint  Patient presents with  . Shortness of Breath    FHx of Hypertrophic Cardiomyopathy     History of Present Illness:    Mindy Juarez is a 61 y.o. female who is being seen today for the evaluation of shortness of breath and family history of hypertrophic cardiomyopathy at the request of Maurice Small, MD.   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  She is here alone today.  Her mother was diagnosed with hypertrophic cardiomyopathy at age 78 when she went in for hip surgery.  She was told that her children should be screened.  The patient has not had a history of heart disease.  She has been quite active.  She likes to go hiking.  She also teaches water classes.  She works out with a Clinical research associate 2 or 3 days a week.  She has had to cut back on some of her activity due to work schedules.  She quit running a couple of years ago secondary to arthritis.  She has not had exertional chest discomfort.  She does note some shortness of breath at times but this is not limiting.  She denies paroxysmal nocturnal dyspnea.  She denies syncope.  She does note some dependent lower extremity edema.  She had a long history of palpitations at a young age.  This seemed to improve during pregnancy.  She was instructed on Valsalva maneuvers.  She has had rare episodes of palpitations since.  PAD Screen 11/30/2016  Previous PAD dx? No  Previous surgical procedure? No  Pain with walking? Yes  Subsides with rest? Yes  Feet/toe relief with dangling? No  Painful, non-healing ulcers? No  Extremities discolored? No    Prior CV studies:   The following studies were reviewed today:  None  Past Medical  History:  Diagnosis Date  . Elevated liver enzymes 2018  . Osteoarthritis    +ANA; followed by Rheumatology (Deveshwar)  . Osteoporosis 05/16/2016  . Vitamin D deficiency     Past Surgical History:  Procedure Laterality Date  . CARPAL TUNNEL RELEASE Right 2009  . [place]  . HIP ARTHROSCOPY W/ LABRAL REPAIR Left 2007  . TRIGGER FINGER RELEASE     thumb    Current Medications: Current Meds  Medication Sig  . Calcium Citrate 250 MG TABS Take 1 tablet by mouth daily.   . cholecalciferol (VITAMIN D) 1000 UNITS tablet Take 5,000 Units by mouth 3 (three) times a week.   . fexofenadine (ALLEGRA) 180 MG tablet Take 180 mg by mouth daily.  Marland Kitchen glucosamine-chondroitin 500-400 MG tablet Take 3 tablets by mouth daily. 1500/1200  . Misc Natural Products (TART CHERRY ADVANCED PO) Take 800 mg by mouth daily.   . Multiple Vitamins-Minerals (MULTIVITAMIN WITH MINERALS) tablet Take 1 tablet by mouth daily.  . mupirocin cream (BACTROBAN) 2 % Apply 1 application topically 3 (three) times daily.  . Omega-3 Fatty Acids (FISH OIL PO) Take 448 mg by mouth. Two tablets 3 days per week     Allergies:   Tramadol and Codeine   Social History   Social History  .  Marital status: Married    Spouse name: N/A  . Number of children: N/A  . Years of education: N/A   Occupational History  . Academic Coach    Social History Main Topics  . Smoking status: Former Smoker    Years: 2.00  . Smokeless tobacco: Never Used  . Alcohol use 2.4 oz/week    4 Glasses of wine per week  . Drug use: No  . Sexual activity: Not Asked   Other Topics Concern  . 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 There is no history of Sudden Cardiac Death.  ROS:   Please see the history of present illness.    ROS All other systems reviewed and are negative.   EKGs/Labs/Other Test Reviewed:    EKG:  EKG is   ordered today.  The ekg ordered today demonstrates sinus bradycardia, heart rate 52, normal axis, QTC 412 ms, no old tracing to compare to  Recent Labs:  Blood 05/26/16 (from PCP): Hgb 13.1, creatinine 0.71, K 4.4, ALT 31, TSH 2.19 06/10/2016: Magnesium 2.1 09/13/2016: ALT 42; BUN 14; Creat 0.64; Hemoglobin 13.1; Platelets 224; Potassium 4.3; Sodium 140; TSH 2.54    Recent Lipid Panel 05/26/16 (from PCP): TC 162, triglycerides 30, LDL 75, HDL 81 Lab Results  Component Value Date/Time   CHOL 159 05/29/2015 04:47 PM   TRIG 35 05/29/2015 04:47 PM   HDL 91 05/29/2015 04:47 PM   CHOLHDL 1.7 05/29/2015 04:47 PM   LDLCALC 61 05/29/2015 04:47 PM    Physical Exam:    VS:  BP 138/70 (BP Location: Right Arm)   Pulse (!) 52   Ht 5\' 6"  (1.676 m)   Wt 158 lb 12.8 oz (72 kg)   LMP 11/08/2006 (Approximate)   SpO2 96%   BMI 25.63 kg/m     Wt Readings from Last 3 Encounters:  11/30/16 158 lb 12.8 oz (72 kg)  10/13/16 152 lb (68.9 kg)  09/13/16 155 lb (70.3 kg)     Physical Exam  Constitutional: She is oriented to person, place, and time. She appears well-developed. No distress.  HENT:  Head: Normocephalic and atraumatic.  Eyes: No scleral icterus.  Neck: No JVD present. Carotid bruit is not present.  Cardiovascular: Normal rate, regular rhythm and normal heart sounds.   No murmur heard. Dorsalis pedis and posterior tibial pulses are 2+ bilaterally  Pulmonary/Chest: Effort normal. She has no wheezes. She has no rales.  Abdominal: Soft. There is no tenderness.    Musculoskeletal: She exhibits no edema.  Neurological: She is alert and oriented to person, place, and time.  Skin: Skin is warm and dry.  Psychiatric: She has a normal mood and affect.    ASSESSMENT:    1. Shortness of breath   2. Family history of hypertrophic cardiomyopathy    PLAN:    In order of problems listed above:  1.  Shortness of breath  She notes mild shortness of breath but it is not  limiting.  She remains quite active.  Her ECG is normal.  Given her family history of hypertrophic cardiomyopathy, we will proceed with an echocardiogram.  At this point, she does not require ischemic testing.  2. Family history of hypertrophic cardiomyopathy As noted, her mother was diagnosed with hypertrophic cardiomyopathy at age 37.  She does not have a murmur on exam and her ECG is normal.  However, she needs to be screened.  An echocardiogram will be obtained as noted above.  Dispo:  Return as needed with Dr. Radford Pax or Richardson Dopp, PA-C depending upon Echocardiogram results.   Medication Adjustments/Labs and Tests Ordered: Current medicines are reviewed at length with the patient today.  Concerns regarding medicines are outlined above.  Orders/Tests:  Orders Placed This Encounter  Procedures  . EKG 12-Lead  . ECHOCARDIOGRAM COMPLETE   Medication changes: No orders of the defined types were placed in this encounter.  Signed, Richardson Dopp, PA-C  11/30/2016 10:53 AM    [facility] [address], [place], [place]  [address] Phone: [phone]; Fax: [phone]

## 2016-11-30 NOTE — Patient Instructions (Signed)
Medication Instructions:  Your physician recommends that you continue on your current medications as directed. Please refer to the Current Medication list given to you today.   Labwork: NONE ORDERED TODAY  Testing/Procedures: Your physician has requested that you have an echocardiogram. Echocardiography is a painless test that uses sound waves to create images of your heart. It provides your doctor with information about the size and shape of your heart and how well your heart's chambers and valves are working. This procedure takes approximately one hour. There are no restrictions for this procedure.    Follow-Up: AS NEEDED WITH DR TURNER PENDING TEST RESULTS  Any Other Special Instructions Will Be Listed Below (If Applicable).     If you need a refill on your cardiac medications before your next appointment, please call your pharmacy.

## 2016-12-05 ENCOUNTER — Encounter: Payer: Self-pay | Admitting: Family Medicine

## 2016-12-05 ENCOUNTER — Ambulatory Visit (INDEPENDENT_AMBULATORY_CARE_PROVIDER_SITE_OTHER): Payer: BC Managed Care – PPO | Admitting: Family Medicine

## 2016-12-05 DIAGNOSIS — M810 Age-related osteoporosis without current pathological fracture: Secondary | ICD-10-CM | POA: Diagnosis not present

## 2016-12-05 NOTE — Patient Instructions (Addendum)
When you feel a true physical hunger, and don't honor that hunger, you are probably contributing to slowing down your metabolic rate.    - To help you feel in control of your appetite, it is going to be extremely important to make sure you don't get over-hungry.   - This probably means eating a little more breakfast and lunch, for example:    Breakfast smoothie as you have been preparing it, plus an egg each day.    Lunch should include at least a portion of protein (at least 20-25 grams) and some carb and plenty of veg's.   - Over the next couple of weeks, work on distinguishing between physical and psychological hunger.    - Record your hunger level pre- and post meals and snacks, using the following scale:    - 0: Famished, empty    - 1: Hungry (appropriately hungry for a meal)    - 2: Neutral    - 3: Satisfied, had enough from this eating episode    - 4: Over-full; wish I'd eaten less  - Look for trends in hunger, and at the end of each day, annotate your record:   1. What could you have done differently to have made a better food choice?    2. Which food choices are you happy with, and what allowed those good choices?  3. What role did HABIT play in this? - Bring this record to next appt for review.     - Look up [place].[facility], and take the The Four Tendencies quiz. Write down your results to talk about at follow-up.    Additional goals: - Vegetables with both lunch and dinner.  - Consistent physical activity.

## 2016-12-05 NOTE — Progress Notes (Signed)
Medical Nutrition Therapy:  Appt start time: 9741 end time:  1630.  Assessment:  Primary concerns today: anti-inflammatory diet as well as osteoporosis.   Mindy Juarez did not want to be weighed today.  She was on vacation recently, which included more food and alcohol than usual.  She also has had less exercise in the past month b/c of work changes, but her schedule is getting back to normal now.    [name] feels habit plays a big role in some of her snacking, e.g., when she gets home from work.  Other problems include being drawn to sweets, and tendency to eat sweets when feeling stressed; and getting over-hungry late-afternoon, pre-dinner.  (Husband gets home late for dinner, so waits for him.)    I suspect that [name]'s very light breakfast and lunch contributes to being over-hungry by both lunch- and dinnertime.  She is worried about increasing kcal at either of these meals, however.    Recent physical activity includes cardio workouts at the gym, 2 X wk, group strength classes 3 X wk and Pilates with [name] 1 X wk.    24-hr recall:  (Up at 7 AM) B (7:45 AM)-  1 slc quiche filling (~1/2 egg, 1/2 oz chs), 1/2 baked apple (incl'd raisins, walnuts, sugar), coffee with 1 tbsp cream, tea  Snk (11:30)-  2 snack-size KitKats L (12:30 PM)-  3/4 c [employer] apple squash soup, 1 1/2 tsp yogurt, 1 tbsp pepitas, 1 slc bread, 1 tsp butter, 1 small Caesar salad, water Snk (2:30)-  4 animal crackers, 1 1/2 c chocolate hummus ([place]; 80 kcal/2 tbsp), water Snk (5 PM)-  1 glass wine D (6:30 PM)-  Lucky 32: 6 oz trout, 1/2 c boiled peanut succotash (corn, b'nut squash, peanuts), 1/2 c swt potato, 1 small pc bread, water Snk ( PM)-  1/2 c tea Typical day? No.  Had leftover quiche batter; usually has green smoothie +/- boiled egg for bkfst.  Also, went out for glass of wine and dinner out.    Progress Towards Goal(s):  In progress.   Nutritional Diagnosis:  No progress on NI-5.7.1 Inadequate protein intake  As related to recommended intake of 55 g/day for her wt of 68 kg.  As evidenced by yesterday's bkfst and lunch including less than 15 grams of protein .      Intervention:  Nutrition education  Handouts given during visit include:  AVS  Demonstrated degree of understanding via:  Teach Back   Barriers to learning/adherence to lifestyle change: Kerston has a longstanding uneasy relationship with food as related to body weight.  She weighs herself daily, and struggles with emotional eating.    Monitoring/Evaluation:  Dietary intake, exercise, and body weight in 2 week(s).

## 2016-12-07 ENCOUNTER — Encounter: Payer: Self-pay | Admitting: Physician Assistant

## 2016-12-07 ENCOUNTER — Ambulatory Visit (HOSPITAL_COMMUNITY): Payer: BC Managed Care – PPO | Attending: Cardiovascular Disease

## 2016-12-07 ENCOUNTER — Other Ambulatory Visit: Payer: Self-pay

## 2016-12-07 DIAGNOSIS — R0602 Shortness of breath: Secondary | ICD-10-CM | POA: Diagnosis present

## 2016-12-07 DIAGNOSIS — Z87891 Personal history of nicotine dependence: Secondary | ICD-10-CM | POA: Diagnosis not present

## 2016-12-19 ENCOUNTER — Ambulatory Visit (INDEPENDENT_AMBULATORY_CARE_PROVIDER_SITE_OTHER): Payer: BC Managed Care – PPO | Admitting: Family Medicine

## 2016-12-19 ENCOUNTER — Encounter: Payer: Self-pay | Admitting: Family Medicine

## 2016-12-19 DIAGNOSIS — M810 Age-related osteoporosis without current pathological fracture: Secondary | ICD-10-CM | POA: Diagnosis not present

## 2016-12-19 NOTE — Patient Instructions (Addendum)
-   Keep in mind the importance of both psychological and physiological appetite.  - For example, how much satisfaction do you get from eating a snack in a rush, standing in the kitchen?  - PLATE all your foods.    From Oct 29:  When you feel a true physical hunger, and don't honor that hunger, you are probably contributing to slowing down your metabolic rate.    - To help you feel in control of your appetite, it is going to be extremely important to make sure you don't get over-hungry.              - Continue to include an egg with your breakfast smoothie, and to get a source of protein, veg's, and some starch for lunch.     - Afternoon hunger times:  Figure out how much of this is psychological or physical hunger or habit.         - USE YOUR EATING DECISIONS ALGORITHM:  What do I really want?  - Write a list of alternatives to snacking, and keep this in a readily accessible place.    - Over the next couple of weeks, work on distinguishing between physical and psychological hunger.               - Record your hunger level pre- and post meals and snacks, using the following scale:               - 0: Famished, empty               - 1: Hungry (appropriately hungry for a meal)               - 2: Neutral               - 3: Satisfied, had enough from this eating episode               - 4: Over-full; wish I'd eaten less  - Thanksgiving:  This is a perfect meal at which to have lots of vegetables.  Physical activity will be especially helpful to your stress levels as well as for reinforcing better food choices, even if it is not your usual routine.   - What will be key to making exercise happen will be PLANNING AHEAD.

## 2016-12-19 NOTE — Progress Notes (Signed)
Medical Nutrition Therapy:  Appt start time: 0900 end time:  1000.  Assessment:  Primary concerns today: anti-inflammatory diet as well as osteoporosis.   Miamor declined a weight check again today, but reported a weight of 152.4 at home this morning.  Although Emyah did not track her hunger or write any of the analyses as recommended, she said she was much more conscious about all we discussed.  Her weight at home has held steady at 152 lb, but she would like to weigh no more than 145 lb.    Lawana realizes a contributing factor in eating sweets at night is the daily phone call with her mother, whose health and well-being she worries about.  In addn, she still reports after-work snacking unrelated to physical hunger, probably mostly habit-driven.  She has not specifically used the Food Decisions Algorithm provided at last appt, to address that question of what she really wants at times she is choosing to snack (usually on something sweet).    Recent physical activity includes cardio workouts at the gym, 2 X wk, group strength classes 3 X wk and Pilates 1 X wk.    24-hr recall suggests:  B (7:30 AM)-  3/4 c cooked oatmeal with hemp seeds, apple, craisins, & raisins; 5 oz Grk yogurt, green tea, 1 c coffee, 2 tbsp cream Snk (10 AM)-  1 c decaf, 2 tbsp cream L (12 PM)-  1 c minestrone soup, 1 pc cornbread, 1 tsp butter, water Snk (3 PM)-  2 graham crkr rectangles, 1/4 c dark choc hummus (160 kcal, 12 g added sugar) D (7 PM)-  1 c mac&chs, 1/2 c cooked peas & carrots, water Snk ( PM)-  4 Dove choc's Typical day? No.  Unusual breakfast and dinner yesterday.    Progress Towards Goal(s):  In progress.   Nutritional Diagnosis:  No progress on NI-5.7.1 Inadequate protein intake As related to recommended intake of 55 g/day for her wt of 68 kg.  As evidenced by yesterday's bkfst and lunch including less than 15 grams of protein .      Intervention:  Nutrition education  Handouts given during visit  include:  AVS  Demonstrated degree of understanding via:  Teach Back   Barriers to learning/adherence to lifestyle change: Gayleen has a longstanding uneasy relationship with food as related to body weight.  She weighs herself daily, and struggles with emotional eating.    Monitoring/Evaluation:  Dietary intake, exercise, and body weight in 4 week(s).

## 2017-01-24 ENCOUNTER — Ambulatory Visit (INDEPENDENT_AMBULATORY_CARE_PROVIDER_SITE_OTHER): Payer: BC Managed Care – PPO | Admitting: Family Medicine

## 2017-01-24 DIAGNOSIS — M810 Age-related osteoporosis without current pathological fracture: Secondary | ICD-10-CM

## 2017-01-24 NOTE — Progress Notes (Signed)
Medical Nutrition Therapy:  Appt start time: 1600 end time:  1700.  Assessment:  Primary concerns today: anti-inflammatory diet as well as osteoporosis.   Mindy Juarez declined a weight check again today, but reported that weight is stable, between 150-152 lb.  She is weighing herself at home 1-2 X wk (down from daily), and pleased that her weight is not going up.  She feels more aware and empowered with respect to food choices, especially considering the baking she has been doing and events she's attended this holiday season.  She has been keeping up exercise routine as well.    Recent physical activity includes cardio workouts at the gym, 2 X wk, group strength classes 3 X wk and Pilates 1 X wk.    Progress Towards Goal(s):  In progress.   Nutritional Diagnosis:  Slight progress on NI-5.7.1 Inadequate protein intake As related to recommended intake of 55 g/day for her wt of 68 kg.  As evidenced by conscious effort to obtain protein at most meals.      Intervention:  Nutrition education  Handouts given during visit include:  AVS  Demonstrated degree of understanding via:  Teach Back   Barriers to learning/adherence to lifestyle change: Mindy Juarez has a longstanding uneasy relationship with food as related to body weight.    Monitoring/Evaluation:  Dietary intake, exercise, and body weight prn.

## 2017-01-24 NOTE — Patient Instructions (Addendum)
-   The times your self-discipline will be lowest are when you are:   - Tired (esp if sleep-deprived, which means actual hormonal changes).  - Hungry (low blood sugar)   Consider also the role of HABIT in your food choices at these vulnerable times.    - Read over the HOLIDAYS handout, and choose something to focus on each day.   - Stick with your dietary and exercise routines during the holidays.    Mindy Juarez: Consider not just how good that food tastes, but how you will feel after eating it.  (Any of her books are really good!)  - If you decide to follow up with an appt in the new year, call or email: [email or web address].

## 2017-02-16 ENCOUNTER — Encounter: Payer: Self-pay | Admitting: Sports Medicine

## 2017-02-16 ENCOUNTER — Ambulatory Visit: Payer: BC Managed Care – PPO | Admitting: Sports Medicine

## 2017-02-16 VITALS — BP 126/73 | Ht 67.0 in | Wt 152.0 lb

## 2017-02-16 DIAGNOSIS — G8929 Other chronic pain: Secondary | ICD-10-CM | POA: Diagnosis not present

## 2017-02-16 DIAGNOSIS — M25512 Pain in left shoulder: Secondary | ICD-10-CM

## 2017-02-16 MED ORDER — NAPROXEN 500 MG PO TABS: ORAL_TABLET | ORAL | 0 refills | Status: DC

## 2017-02-16 NOTE — Progress Notes (Signed)
   [facility] Kerrin Mo, MD Phone: [phone]  Reason For Visit: Shoulder pain   # Patient presenting with significant past medical history of osteoarthritis, notable in the right shoulder and bilateral knees, presenting with left shoulder pain since the end of November 2018.  States she was doing exercise with dumbbells was trying to limit use of her right shoulder and therefore increased her exercise level and use on her left shoulder.  She then developed pain in her left shoulder.   Patient does indicate that the pain does radiate down her shoulder but never goes below her elbow.  She sometimes indicates there is some neck pain associated with her shoulder pain. This pain was exacerbated by trying to reach for things.  Denies any swelling or erythema in her shoulder.  Since the onset of pain she has been doing range of motion exercises, limiting her activity, doing Pilates.  However she is not significantly improved.  She has also tried using diclofenac patches which have helped limitedly.  Past Medical History Reviewed problem list.  Medications- reviewed and updated No additions to family history  Objective: BP 126/73   Ht 5\' 7"  (1.702 m)   Wt 152 lb (68.9 kg)   LMP 11/08/2006 (Approximate)   BMI 23.81 kg/m  Gen: NAD, alert, cooperative with exam Shoulder: No abnormalities noted on section, no specific area that is painful on palpation, full range of motion with abduction, abduction, flexion, and extension of shoulder, some decrease in internal rotation though patient is notably more painful with abduction at about 90-150 degrees.  Strength is intact on examination, negative Yergason and speeds, negative drop arm test, negative empty can, positive neers, Spurling's, strength intact, neurovascularly intact, Skin: dry, intact, no rashes or lesions  Assessment/Plan: See problem based a/p   1. Chronic left shoulder pain: Likely with rotator cuff  tendinopathy.  Given patient strength not likely a tear, have a history of osteoarthritis however more concerning right now for rotator cuff tendinopathy. - Ambulatory referral to Physical Therapy -Naproxen 500 mg twice daily for 1 week -Follow-up in about 4 weeks, if no improvement will consider steroid injection at that time as patient is reticent at this visit  Patient seen and evaluated with the resident. I agree with the above plan of care. Patient's physical exam shows a positive painful arc as well as a positive empty can positive Hawkins. Her rotator cuff strength is fairly well-preserved. Were going to treat this like rotator cuff tendinopathy. I've placed her on a short course of oral anti-inflammatories and she will start physical therapy. Follow-up with me in 4 weeks. If symptoms persist or worsen then I would consider a subacromial cortisone injection or a shoulder ultrasound to rule out rotator cuff tear. Patient will avoid overhead activities in the gym. Call with questions or concerns prior to her follow-up visit.

## 2017-02-27 ENCOUNTER — Encounter: Payer: Self-pay | Admitting: Physical Therapy

## 2017-02-27 ENCOUNTER — Ambulatory Visit: Payer: BC Managed Care – PPO | Attending: Sports Medicine | Admitting: Physical Therapy

## 2017-02-27 DIAGNOSIS — G8929 Other chronic pain: Secondary | ICD-10-CM | POA: Diagnosis present

## 2017-02-27 DIAGNOSIS — M25512 Pain in left shoulder: Secondary | ICD-10-CM | POA: Insufficient documentation

## 2017-02-27 DIAGNOSIS — R293 Abnormal posture: Secondary | ICD-10-CM | POA: Insufficient documentation

## 2017-02-27 NOTE — Patient Instructions (Signed)
Resisted External Rotation: in Neutral - Bilateral    Sit or stand, tubing in both hands, elbows at sides, bent to 90, forearms forward. Pinch shoulder blades together and rotate forearms out. Keep elbows at sides. Repeat _10___ times per set. Do __1-2__ sets per session. Do __2__ sessions per day.  http://orth.exer.us/967   Copyright  VHI. All rights reserved.   Resisted Horizontal Abduction: Bilateral    Sit or stand, tubing in both hands, arms out in front. Keeping arms straight, pinch shoulder blades together and stretch arms out. Repeat ___10_ times per set. Do ___1-2_ sets per session. Do __2__ sessions per day.  http://orth.exer.us/969   Copyright  VHI. All rights reserved.

## 2017-02-28 NOTE — Therapy (Signed)
[place], [place], [address] Phone: [phone]   Fax:  [phone]  Physical Therapy Evaluation  Patient Details  Name: Mindy Juarez MRN: [id number] Date of Birth: 03/31/1955 Referring Provider: Dr. Micheline Chapman   Encounter Date: 02/27/2017  PT End of Session - 02/28/17 0834    Visit Number  1    Number of Visits  8    Date for PT Re-Evaluation  03/31/17    PT Start Time  1330    PT Stop Time  1430    PT Time Calculation (min)  60 min    Activity Tolerance  Patient tolerated treatment well    Behavior During Therapy  [facility] for tasks assessed/performed       Past Medical History:  Diagnosis Date  . Elevated liver enzymes 2018  . History of echocardiogram    Echo 10/18: EF 60-65, normal wall motion, normal diastolic function, mild LAE  . Osteoarthritis    +ANA; followed by Rheumatology (Deveshwar)  . Osteoporosis 05/16/2016  . Vitamin D deficiency     Past Surgical History:  Procedure Laterality Date  . CARPAL TUNNEL RELEASE Right 2009  . [place]  . HIP ARTHROSCOPY W/ LABRAL REPAIR Left 2007  . TRIGGER FINGER RELEASE     thumb    There were no vitals filed for this visit.   Subjective Assessment - 02/27/17 1336    Subjective  Pt reports onset of L shoulder pain around the holidays.  She has Rt. shoulder issues as well.  She was at the gym and was performing push ups and some dumbbell exercises and the next day she was unable to move her arm across her body, had severe pain.  She complains of pain worse than last week as she stopped taking Naproxen more regularly.   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      Pertinent History  knee pain, hip pain, Rt UE pain (osteoarthritis), osteoporosis    Limitations  Lifting;House hold activities     Diagnostic tests  none of shoulder     Patient Stated Goals  to learn how to move her shoulder better     Currently in Pain?  Yes    Pain Score  0-No pain reaching across body 5/10.     Pain Location  Shoulder    Pain Orientation  Left    Pain Descriptors / Indicators  Aching;Sharp;Throbbing throbs at night     Pain Type  Chronic pain    Pain Radiating Towards  up towards neck, occ to elbow     Pain Onset  More than a month ago    Pain Frequency  Intermittent    Aggravating Factors   reaching across for ADLs, take shirt off     Pain Relieving Factors  rest, meds/ Naproxen , ice     Effect of Pain on Daily Activities  limits activity , pain increases post treatment , modifies her routine (no overhead, cautious with WB)          [facility] PT Assessment - 02/28/17 0001      Assessment   Medical Diagnosis  Chronic L shoulder pain     Referring Provider  Dr. Micheline Chapman    Onset Date/Surgical Date  12/29/16 approx.     Next MD Visit  4  weeks     Prior Therapy  yes, for hip      Precautions   Precautions  None      Restrictions   Weight Bearing Restrictions  No      Balance Screen   Has the patient fallen in the past 6 months  No    Has the patient had a decrease in activity level because of a fear of falling?   No    Is the patient reluctant to leave their home because of a fear of falling?   No      Home Film/video editor residence    Living Arrangements  Spouse/significant other      Prior Function   Level of Independence  Independent      Cognition   Overall Cognitive Status  Within Functional Limits for tasks assessed      Observation/Other Assessments   Focus on Therapeutic Outcomes (FOTO)   44%      Sensation   Light Touch  Appears Intact      Posture/Postural Control   Posture/Postural Control  Postural limitations    Postural Limitations  Increased thoracic kyphosis    Posture Comments  upper torso rotated to the L       AROM   Right  Shoulder Flexion  150 Degrees    Right Shoulder ABduction  160 Degrees    Right Shoulder Internal Rotation  -- FR to T5    Left Shoulder Flexion  132 Degrees    Left Shoulder ABduction  150 Degrees    Left Shoulder Internal Rotation  65 Degrees FR T6    Left Shoulder External Rotation  74 Degrees FR to T2      Strength   Right Shoulder Flexion  4+/5    Right Shoulder Internal Rotation  5/5    Right Shoulder External Rotation  4+/5    Left Shoulder Flexion  4/5 strain     Left Shoulder ABduction  4/5    Left Shoulder Internal Rotation  4/5    Left Shoulder External Rotation  3/5 pain inhibition      Palpation   Palpation comment  tender/pain in posterior cuff (teres minor) as well as anterior shoulder      Hawkins-Kennedy test   Findings  Positive      Empty Can test   Findings  Positive             Objective measurements completed on examination: See above findings.     PT Education - 02/28/17 0829    Education provided  Yes    Education Details  PT/POC, HEP    Person(s) Educated  Patient    Methods  Explanation;Demonstration;Verbal cues;Handout    Comprehension  Verbalized understanding;Returned demonstration;Verbal cues required          PT Long Term Goals - 02/28/17 0835      PT LONG TERM GOAL #1   Title  Pt will be I with HEP for scapular stabilization, posture    Time  4    Period  Weeks    Status  New    Target Date  03/31/17      PT LONG TERM GOAL #2   Title  Pt will score less than 30% on FOTO to demo increased functional mobility.     Time  4    Period  Weeks    Status  New    Target Date  03/31/17  PT LONG TERM GOAL #3   Title  Pt will be able to consistently reach across to opposite shoulder and complete upper body dressing.     Time  4    Period  Weeks    Status  New    Target Date  03/31/17      PT LONG TERM GOAL #4   Title  Pt will demo normal 4+/5 to 5/5 strength throughout L shoulder including external rotation.     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  She has pain with reaching up and across.  She has good strength in her UE so I do not think she has a tear.  She does have painful external rotation , scored 3/5 for pain.  She has a good exercise routine so we will offer modalities and specific corrective exercise, manual therapy.      Clinical Presentation  Stable    Clinical Decision Making  Low    Rehab Potential  Excellent    PT Frequency  2x / week    PT Duration  4 weeks    PT Treatment/Interventions  ADLs/Self Care Home Management;Electrical Stimulation;Neuromuscular re-education;Taping;Therapeutic activities;Iontophoresis 4mg /ml Dexamethasone;Cryotherapy;Ultrasound;Therapeutic exercise;Patient/family education;Manual techniques;Moist Heat;Dry needling;Passive range of motion    PT Next Visit Plan  check HEP, manual, scapular stabilization, ionto patch     PT Home Exercise Plan  ER and horizontal abduction     Consulted and Agree with Plan of Care  Patient       Patient will benefit from skilled therapeutic intervention in order to improve the following deficits and impairments:  Pain, Postural dysfunction, Impaired UE functional use, Increased fascial restricitons, Decreased strength, Decreased range of motion, Decreased mobility  Visit Diagnosis: Chronic left shoulder pain  Abnormal posture     Problem List Patient Active Problem List   Diagnosis Date Noted  . Elevated LFTs/ has been evaluated by Dr Collene Mares GI  09/08/2016  . Multiple joint pain 09/08/2016  . ANA positive 08/16/2016   . Osteopenia 08/16/2016  . Acute pain of left foot 01/21/2015  . Left hip pain 03/06/2014  . Shoulder pain, bilateral 03/06/2014  . Pain in joint, ankle and foot 08/15/2013  . Right elbow pain 06/04/2013  . Medial epicondylitis of right elbow 06/04/2013  . Knee pain 06/05/2012  . Patellofemoral arthritis 06/05/2012    Kelsie Zaborowski 02/28/2017, 9:02 AM  [facility] [address] [place], [place], [address] Phone: [phone]   Fax:  [phone]  Name: Mindy Juarez MRN: [id number] Date of Birth: [birth year]   Raeford Razor, PT 02/28/17 9:02 AM Phone: [phone] Fax: [phone]

## 2017-03-06 ENCOUNTER — Ambulatory Visit: Payer: BC Managed Care – PPO | Admitting: Physical Therapy

## 2017-03-06 ENCOUNTER — Encounter: Payer: Self-pay | Admitting: Physical Therapy

## 2017-03-06 DIAGNOSIS — G8929 Other chronic pain: Secondary | ICD-10-CM

## 2017-03-06 DIAGNOSIS — M25512 Pain in left shoulder: Secondary | ICD-10-CM | POA: Diagnosis not present

## 2017-03-06 DIAGNOSIS — R293 Abnormal posture: Secondary | ICD-10-CM

## 2017-03-06 NOTE — Therapy (Signed)
[place], [place], [address] Phone: [phone]   Fax:  [phone]  Physical Therapy Treatment  Patient Details  Name: Mindy Juarez MRN: [id number] Date of Birth: 08-01-1955 Referring Provider: Dr. Micheline Chapman   Encounter Date: 03/06/2017  PT End of Session - 03/06/17 0851    Visit Number  2    Number of Visits  8    Date for PT Re-Evaluation  03/31/17    PT Start Time  0850    PT Stop Time  0932    PT Time Calculation (min)  42 min       Past Medical History:  Diagnosis Date  . Elevated liver enzymes 2018  . History of echocardiogram    Echo 10/18: EF 60-65, normal wall motion, normal diastolic function, mild LAE  . Osteoarthritis    +ANA; followed by Rheumatology (Deveshwar)  . Osteoporosis 05/16/2016  . Vitamin D deficiency     Past Surgical History:  Procedure Laterality Date  . CARPAL TUNNEL RELEASE Right 2009  . [place]  . HIP ARTHROSCOPY W/ LABRAL REPAIR Left 2007  . TRIGGER FINGER RELEASE     thumb    There were no vitals filed for this visit.  Subjective Assessment - 03/06/17 0852    Subjective  Slowly improving. I would like a return of pain free ROM and know how to move shoulders through normal daily activities and exercise to avoid injury. Shoulder feels heavy and it feels like the arm is going to fall off. pulling with use of upper back is okay, overhead is painful. Cannot reach across body to remove shirt.                       [facility] Adult PT Treatment/Exercise - 03/06/17 0001      Shoulder Exercises: Standing   Other Standing Exercises  abd at wall-avoiding lumbar extension, discussed posture/abdominal engagement/breathing with movement & affect on shoulder    Other Standing Exercises  door pec stretch      Manual Therapy   Manual Therapy  Soft tissue mobilization;Joint mobilization    Manual therapy comments  skilled palpation and  monitoring during TPDN    Joint Mobilization  Gr 4 GHJ sup to inf, AP    Soft tissue mobilization  IASTM biceps & pecs, pec bending       Trigger Point Dry Needling - 03/06/17 1014    Consent Given?  Yes    Education Handout Provided  -- verbal education    Muscles Treated Upper Body  Pectoralis minor biceps    Pectoralis Minor Response  Twitch response elicited;Palpable increased muscle length           PT Education - 03/06/17 1258    Education provided  Yes    Education Details  TPDN & expected outcomes, postural alignment with movement- core & breathing, working in pain free ranges for retraining    Person(s) Educated  Patient    Methods  Explanation    Comprehension  Verbalized understanding;Need further instruction          PT Long Term Goals - 02/28/17 0835      PT LONG TERM GOAL #1   Title  Pt will be I with HEP for scapular stabilization, posture    Time  4    Period  Weeks    Status  New    Target Date  03/31/17  PT LONG TERM GOAL #2   Title  Pt will score less than 30% on FOTO to demo increased functional mobility.     Time  4    Period  Weeks    Status  New    Target Date  03/31/17      PT LONG TERM GOAL #3   Title  Pt will be able to consistently reach across to opposite shoulder and complete upper body dressing.     Time  4    Period  Weeks    Status  New    Target Date  03/31/17      PT LONG TERM GOAL #4   Title  Pt will demo normal 4+/5 to 5/5 strength throughout L shoulder including external rotation.     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 DN to pectoralis and biceps on Lt side to decrease forward displacement and allow retraction in movement. Advised pt she may be sore and to stretch frequently throughout the day.     PT Treatment/Interventions  ADLs/Self Care Home Management;Electrical Stimulation;Neuromuscular re-education;Taping;Therapeutic activities;Iontophoresis 4mg /ml Dexamethasone;Cryotherapy;Ultrasound;Therapeutic exercise;Patient/family education;Manual techniques;Moist Heat;Dry needling;Passive range of motion    PT Next Visit Plan  check HEP, manual, scapular stabilization, ionto patch     PT Home Exercise Plan  ER and horizontal abduction, door pec stretch     Consulted and Agree with Plan of Care  Patient       Patient will benefit from skilled therapeutic intervention in order to improve the following deficits and impairments:  Pain, Postural dysfunction, Impaired UE functional use, Increased fascial restricitons, Decreased strength, Decreased range of motion, Decreased mobility  Visit Diagnosis: Chronic left shoulder pain  Abnormal posture     Problem List Patient Active Problem List   Diagnosis Date Noted  . Elevated LFTs/ has been evaluated by Dr Collene Mares GI  09/08/2016  . Multiple joint pain 09/08/2016  . ANA positive 08/16/2016  . Osteopenia 08/16/2016  . Acute pain of left foot 01/21/2015  . Left hip pain 03/06/2014  . Shoulder pain, bilateral 03/06/2014  . Pain in joint, ankle and foot 08/15/2013  . Right elbow pain 06/04/2013  . Medial epicondylitis of right elbow 06/04/2013  . Knee pain 06/05/2012  . Patellofemoral arthritis 06/05/2012   Demere Dotzler C. Janeisha Ryle PT, DPT 03/06/17 1:01 PM   [place], [place], [address] Phone: [phone]   Fax:  [phone]  Name: ASHIYA KINKEAD MRN: [id number] Date of Birth: [birth year]

## 2017-03-13 ENCOUNTER — Ambulatory Visit: Payer: BC Managed Care – PPO | Attending: Sports Medicine | Admitting: Physical Therapy

## 2017-03-13 ENCOUNTER — Encounter: Payer: Self-pay | Admitting: Physical Therapy

## 2017-03-13 DIAGNOSIS — M25512 Pain in left shoulder: Secondary | ICD-10-CM | POA: Diagnosis not present

## 2017-03-13 DIAGNOSIS — R293 Abnormal posture: Secondary | ICD-10-CM | POA: Insufficient documentation

## 2017-03-13 DIAGNOSIS — G8929 Other chronic pain: Secondary | ICD-10-CM

## 2017-03-13 NOTE — Therapy (Signed)
[place], [place], [address] Phone: [phone]   Fax:  [phone]  Physical Therapy Treatment  Patient Details  Name: Mindy Juarez MRN: [id number] Date of Birth: [birth year] Referring Provider: Dr. Micheline Chapman   Encounter Date: 03/13/2017  PT End of Session - 03/13/17 1425    Visit Number  3    Number of Visits  8    Date for PT Re-Evaluation  03/31/17    PT Start Time  8101    PT Stop Time  1517    PT Time Calculation (min)  57 min    Activity Tolerance  Patient tolerated treatment well    Behavior During Therapy  [facility] for tasks assessed/performed       Past Medical History:  Diagnosis Date  . Elevated liver enzymes 2018  . History of echocardiogram    Echo 10/18: EF 60-65, normal wall motion, normal diastolic function, mild LAE  . Osteoarthritis    +ANA; followed by Rheumatology (Deveshwar)  . Osteoporosis 05/16/2016  . Vitamin D deficiency     Past Surgical History:  Procedure Laterality Date  . CARPAL TUNNEL RELEASE Right 2009  . [place]  . HIP ARTHROSCOPY W/ LABRAL REPAIR Left 2007  . TRIGGER FINGER RELEASE     thumb    There were no vitals filed for this visit.  Subjective Assessment - 03/13/17 1422    Subjective  Needling helped and I was good until Wed.  when it came back.  My shoulders are achy but I feel better that the first day.  I'm not doing any weight bearing at all.  Did pull up with assist machine.      Currently in Pain?  Yes    Pain Score  1     Pain Location  Shoulder    Pain Descriptors / Indicators  Discomfort;Aching    Pain Type  Chronic pain    Pain Onset  More than a month ago    Pain Frequency  Intermittent    Aggravating Factors   see previous     Pain Relieving Factors  non WB and resting                      [facility] Adult PT Treatment/Exercise - 03/13/17 0001      Shoulder Exercises: Supine   Horizontal ABduction   Strengthening;Both;12 reps    External Rotation  Strengthening;Both;15 reps    Flexion  Strengthening;Both;10 reps    Shoulder Flexion Weight (lbs)  with iso ER and magic circle, overhead stretching post foam roller     Other Supine Exercises  overhead narrow grip x 10 (min pain)     Other Supine Exercises  diagonal pull red x 10       Shoulder Exercises: Standing   Other Standing Exercises  corner stretch varied positions       Modalities   Modalities  Cryotherapy;Iontophoresis      Cryotherapy   Number Minutes Cryotherapy  10 Minutes    Cryotherapy Location  Shoulder    Type of Cryotherapy  Ice pack      Iontophoresis   Type of Iontophoresis  Dexamethasone    Location  L shoulder posterior    Dose  1 cc     Time  6 hr patch       Manual Therapy   Manual Therapy  Scapular mobilization    Joint Mobilization  A/P Gr  I-II gentle     Soft tissue mobilization  L periscapular compression     Scapular Mobilization  L scapular, subscapularis release              PT Education - 03/13/17 1451    Education provided  Yes    Education Details  ionto patch, capsule tightness     Person(s) Educated  Patient    Methods  Explanation    Comprehension  Verbalized understanding          PT Long Term Goals - 03/13/17 1436      PT LONG TERM GOAL #1   Title  Pt will be I with HEP for scapular stabilization, posture    Status  On-going      PT LONG TERM GOAL #2   Title  Pt will score less than 30% on FOTO to demo increased functional mobility.     Status  On-going      PT LONG TERM GOAL #3   Title  Pt will be able to consistently reach across to opposite shoulder and complete upper body dressing.     Baseline  improve the past few days     Status  On-going      PT LONG TERM GOAL #4   Title  Pt will demo normal 4+/5 to 5/5 strength throughout L shoulder including external rotation.     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  Pt with crepitus during abduction. Pain increased with end range abduction/ER and IR.  Tightness noted in posterior capsule bilaterally.  Trial of Ionto for pain control L post superior shoulder.     PT Treatment/Interventions  ADLs/Self Care Home Management;Electrical Stimulation;Neuromuscular re-education;Taping;Therapeutic activities;Iontophoresis 4mg /ml Dexamethasone;Cryotherapy;Ultrasound;Therapeutic exercise;Patient/family education;Manual techniques;Moist Heat;Dry needling;Passive range of motion    PT Next Visit Plan  check HEP, manual, scapular stabilization, ionto patch     PT Home Exercise Plan  ER and horizontal abduction, door pec stretch , post capsule sleeper     Consulted and Agree with Plan of Care  Patient       Patient will benefit from skilled therapeutic intervention in order to improve the following deficits and impairments:  Pain, Postural dysfunction, Impaired UE functional use, Increased fascial restricitons, Decreased strength, Decreased range of motion, Decreased mobility  Visit Diagnosis: Chronic left shoulder pain  Abnormal posture     Problem List Patient Active Problem List   Diagnosis Date Noted  . Elevated LFTs/ has been evaluated by Dr Collene Mares GI  09/08/2016  . Multiple joint pain 09/08/2016  . ANA positive 08/16/2016  . Osteopenia 08/16/2016  . Acute pain of left foot 01/21/2015  . Left hip pain 03/06/2014  . Shoulder pain, bilateral 03/06/2014  . Pain in joint, ankle and foot 08/15/2013  . Right elbow pain 06/04/2013  . Medial epicondylitis of right elbow 06/04/2013  . Knee pain 06/05/2012  . Patellofemoral arthritis 06/05/2012    Mindy Juarez 03/13/2017, 3:22 PM  [facility] [address] [place], [place], [address] Phone: [phone]   Fax:  [phone]  Name: Mindy Juarez  MRN: [id number] Date of Birth: 1956-01-29   Raeford Razor, PT 03/13/17 3:22 PM Phone: [phone] Fax: [phone]

## 2017-03-13 NOTE — Patient Instructions (Addendum)
Posterior Capsule Sleeper Stretch, Side-Lying    Lie on side, pillow under head, neck in neutral, underside arm in 90-90 of shoulder and elbow flexion with scapula fixed to table. Use other hand to press back of underside arm forward and downward. Keep elbow angle. Hold _10-15__ seconds.  Repeat _5__ times per session. Do __2_ sessions per day. Advanced: Use PNF contract-relax method.  Copyright  VHI. All rights reserved.   IONTOPHORESIS PATIENT PRECAUTIONS & CONTRAINDICATIONS:  . Redness under one or both electrodes can occur.  This characterized by a uniform redness that usually disappears within 12 hours of treatment. . Small pinhead size blisters may result in response to the drug.  Contact your physician if the problem persists more than 24 hours. . On rare occasions, iontophoresis therapy can result in temporary skin reactions such as rash, inflammation, irritation or burns.  The skin reactions may be the result of individual sensitivity to the ionic solution used, the condition of the skin at the start of treatment, reaction to the materials in the electrodes, allergies or sensitivity to dexamethasone, or a poor connection between the patch and your skin.  Discontinue using iontophoresis if you have any of these reactions and report to your therapist. . Remove the Patch or electrodes if you have any undue sensation of pain or burning during the treatment and report discomfort to your therapist. . Tell your Therapist if you have had known adverse reactions to the application of electrical current. . If using the Patch, the LED light will turn off when treatment is complete and the patch can be removed.  Approximate treatment time is 1-3 hours.  Remove the patch when light goes off or after 6 hours. . The Patch can be worn during normal activity, however excessive motion where the electrodes have been placed can cause poor contact between the skin and the electrode or uneven electrical  current resulting in greater risk of skin irritation. Marland Kitchen Keep out of the reach of children.   . DO NOT use if you have a cardiac pacemaker or any other electrically sensitive implanted device. . DO NOT use if you have a known sensitivity to dexamethasone. . DO NOT use during Magnetic Resonance Imaging (MRI). . DO NOT use over broken or compromised skin (e.g. sunburn, cuts, or acne) due to the increased risk of skin reaction. . DO NOT SHAVE over the area to be treated:  To establish good contact between the Patch and the skin, excessive hair may be clipped. . DO NOT place the Patch or electrodes on or over your eyes, directly over your heart, or brain. . DO NOT reuse the Patch or electrodes as this may cause burns to occur.

## 2017-03-16 ENCOUNTER — Ambulatory Visit: Payer: BC Managed Care – PPO | Admitting: Physical Therapy

## 2017-03-16 ENCOUNTER — Encounter: Payer: Self-pay | Admitting: Physical Therapy

## 2017-03-16 DIAGNOSIS — M25512 Pain in left shoulder: Secondary | ICD-10-CM | POA: Diagnosis not present

## 2017-03-16 DIAGNOSIS — G8929 Other chronic pain: Secondary | ICD-10-CM

## 2017-03-16 DIAGNOSIS — R293 Abnormal posture: Secondary | ICD-10-CM

## 2017-03-16 NOTE — Therapy (Signed)
[place], [place], [address] Phone: [phone]   Fax:  [phone]  Physical Therapy Treatment  Patient Details  Name: Mindy Juarez MRN: [id number] Date of Birth: Apr 07, 1955 Referring Provider: Dr. Micheline Chapman   Encounter Date: 03/16/2017  PT End of Session - 03/16/17 0945    Visit Number  4    Number of Visits  8    Date for PT Re-Evaluation  03/31/17    PT Start Time  0940    PT Stop Time  1010    PT Time Calculation (min)  30 min    Activity Tolerance  Patient tolerated treatment well    Behavior During Therapy  [facility] for tasks assessed/performed       Past Medical History:  Diagnosis Date  . Elevated liver enzymes 2018  . History of echocardiogram    Echo 10/18: EF 60-65, normal wall motion, normal diastolic function, mild LAE  . Osteoarthritis    +ANA; followed by Rheumatology (Deveshwar)  . Osteoporosis 05/16/2016  . Vitamin D deficiency     Past Surgical History:  Procedure Laterality Date  . CARPAL TUNNEL RELEASE Right 2009  . [place]  . HIP ARTHROSCOPY W/ LABRAL REPAIR Left 2007  . TRIGGER FINGER RELEASE     thumb    There were no vitals filed for this visit.  Subjective Assessment - 03/16/17 0945    Subjective  Was feeling pretty good after ionto and then was dressing Tues morning (thinks she was donning pants) and felt a large pop. Now feeling a lot of soreness in adduction and reaching.     Patient Stated Goals  to learn how to move her shoulder better     Currently in Pain?  Yes    Pain Score  7     Pain Location  Shoulder    Pain Orientation  Left    Pain Descriptors / Indicators  Sore;Aching                      [facility] Adult PT Treatment/Exercise - 03/16/17 0001      Manual Therapy   Manual therapy comments  skilled palpation and monitoring during TPDN    Soft tissue mobilization  IASTM Lt infraspinatus       Trigger Point Dry  Needling - 03/16/17 1035    Muscles Treated Upper Body  Infraspinatus Lt    Infraspinatus Response  Twitch response elicited;Palpable increased muscle length           PT Education - 03/16/17 1303    Education provided  Yes    Education Details  discussion of symptoms, ionto patch, TPDN & expected outcomes, continued treatment    Person(s) Educated  Patient    Methods  Explanation    Comprehension  Verbalized understanding;Need further instruction          PT Long Term Goals - 03/13/17 1436      PT LONG TERM GOAL #1   Title  Pt will be I with HEP for scapular stabilization, posture    Status  On-going      PT LONG TERM GOAL #2   Title  Pt will score less than 30% on FOTO to demo increased functional mobility.     Status  On-going      PT LONG TERM GOAL #3   Title  Pt will be able to consistently reach across to opposite shoulder and complete upper  body dressing.     Baseline  improve the past few days     Status  On-going      PT LONG TERM GOAL #4   Title  Pt will demo normal 4+/5 to 5/5 strength throughout L shoulder including external rotation.     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 Ionto patched placed again today due to success at last visit and will continue DN PRN. Asked pt to keep her arm moving today but avoid painful movements.    PT Treatment/Interventions  ADLs/Self Care Home Management;Electrical Stimulation;Neuromuscular re-education;Taping;Therapeutic activities;Iontophoresis 4mg /ml Dexamethasone;Cryotherapy;Ultrasound;Therapeutic exercise;Patient/family education;Manual techniques;Moist Heat;Dry needling;Passive range of motion    PT Next Visit Plan  check HEP, manual,  scapular stabilization, ionto patch     PT Home Exercise Plan  ER and horizontal abduction, door pec stretch , post capsule sleeper     Consulted and Agree with Plan of Care  Patient       Patient will benefit from skilled therapeutic intervention in order to improve the following deficits and impairments:  Pain, Postural dysfunction, Impaired UE functional use, Increased fascial restricitons, Decreased strength, Decreased range of motion, Decreased mobility  Visit Diagnosis: Chronic left shoulder pain  Abnormal posture     Problem List Patient Active Problem List   Diagnosis Date Noted  . Elevated LFTs/ has been evaluated by Dr Collene Mares GI  09/08/2016  . Multiple joint pain 09/08/2016  . ANA positive 08/16/2016  . Osteopenia 08/16/2016  . Acute pain of left foot 01/21/2015  . Left hip pain 03/06/2014  . Shoulder pain, bilateral 03/06/2014  . Pain in joint, ankle and foot 08/15/2013  . Right elbow pain 06/04/2013  . Medial epicondylitis of right elbow 06/04/2013  . Knee pain 06/05/2012  . Patellofemoral arthritis 06/05/2012    Tajia Szeliga C. Iley Breeden PT, DPT 03/16/17 1:06 PM   [facility] [address] [place], [place], [address] Phone: [phone]   Fax:  [phone]  Name: Mindy Juarez MRN: [id number] Date of Birth: December 27, 1955

## 2017-03-20 ENCOUNTER — Encounter: Payer: Self-pay | Admitting: Physical Therapy

## 2017-03-20 ENCOUNTER — Ambulatory Visit: Payer: BC Managed Care – PPO | Admitting: Physical Therapy

## 2017-03-20 DIAGNOSIS — G8929 Other chronic pain: Secondary | ICD-10-CM

## 2017-03-20 DIAGNOSIS — M25512 Pain in left shoulder: Secondary | ICD-10-CM | POA: Diagnosis not present

## 2017-03-20 DIAGNOSIS — R293 Abnormal posture: Secondary | ICD-10-CM

## 2017-03-20 NOTE — Therapy (Signed)
[place], [place], [address] Phone: [phone]   Fax:  [phone]  Physical Therapy Treatment  Patient Details  Name: Mindy Juarez MRN: [id number] Date of Birth: 22-Feb-1955 Referring Provider: Dr. Micheline Chapman   Encounter Date: 03/20/2017  PT End of Session - 03/20/17 1558    Visit Number  5    Number of Visits  8    Date for PT Re-Evaluation  03/31/17    PT Start Time  0867 pt arrived late    PT Stop Time  1632    PT Time Calculation (min)  34 min    Activity Tolerance  Patient tolerated treatment well    Behavior During Therapy  [facility] for tasks assessed/performed       Past Medical History:  Diagnosis Date  . Elevated liver enzymes 2018  . History of echocardiogram    Echo 10/18: EF 60-65, normal wall motion, normal diastolic function, mild LAE  . Osteoarthritis    +ANA; followed by Rheumatology (Deveshwar)  . Osteoporosis 05/16/2016  . Vitamin D deficiency     Past Surgical History:  Procedure Laterality Date  . CARPAL TUNNEL RELEASE Right 2009  . [place]  . HIP ARTHROSCOPY W/ LABRAL REPAIR Left 2007  . TRIGGER FINGER RELEASE     thumb    There were no vitals filed for this visit.  Subjective Assessment - 03/20/17 1558    Subjective  The pain I was having last time is gone. ROM is increasing, I just wish I had less pain with increasing motion. Donning a coat is painful as is reaching to pull off overhead clothing, reaching to lotion back.                       [facility] Adult PT Treatment/Exercise - 03/20/17 0001      Exercises   Exercises  Shoulder      Shoulder Exercises: Seated   External Rotation Weight (lbs)  2    External Rotation Limitations  elbow on table in abd    Flexion Limitations  with abd on band, from table     Other Seated Exercises  bilat UE ER yellow tband, elbows on table    Other Seated Exercises  single arm ER yellow tband,  elbows on table in flexion, yellow tband             PT Education - 03/20/17 1723    Education provided  Yes    Education Details  types of pain to "push through" exercise form/rationale, sleeping posture    Person(s) Educated  Patient    Methods  Explanation;Demonstration;Tactile cues;Verbal cues;Handout    Comprehension  Verbalized understanding;Need further instruction;Returned demonstration;Verbal cues required;Tactile cues required          PT Long Term Goals - 03/13/17 1436      PT LONG TERM GOAL #1   Title  Pt will be I with HEP for scapular stabilization, posture    Status  On-going      PT LONG TERM GOAL #2   Title  Pt will score less than 30% on FOTO to demo increased functional mobility.     Status  On-going      PT LONG TERM GOAL #3   Title  Pt will be able to consistently reach across to opposite shoulder and complete upper body dressing.     Baseline  improve the past few days  Status  On-going      PT LONG TERM GOAL #4   Title  Pt will demo normal 4+/5 to 5/5 strength throughout L shoulder including external rotation.     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 Stressed importance of pain-free motions to strengthen rather than "pushing through pain" we also discussed types of pain that are okay to move through.     PT Treatment/Interventions  ADLs/Self Care Home Management;Electrical Stimulation;Neuromuscular re-education;Taping;Therapeutic activities;Iontophoresis 4mg /ml Dexamethasone;Cryotherapy;Ultrasound;Therapeutic exercise;Patient/family education;Manual techniques;Moist Heat;Dry needling;Passive range of motion    PT Next Visit Plan  ionto PRN, scapular stabilization, continue to breakdown  painful movements    PT Home Exercise Plan  ER and horizontal abduction, door pec stretch , post capsule sleeper, elbows on table UE ER     Consulted and Agree with Plan of Care  Patient       Patient will benefit from skilled therapeutic intervention in order to improve the following deficits and impairments:  Pain, Postural dysfunction, Impaired UE functional use, Increased fascial restricitons, Decreased strength, Decreased range of motion, Decreased mobility  Visit Diagnosis: Chronic left shoulder pain  Abnormal posture     Problem List Patient Active Problem List   Diagnosis Date Noted  . Elevated LFTs/ has been evaluated by Dr Collene Mares GI  09/08/2016  . Multiple joint pain 09/08/2016  . ANA positive 08/16/2016  . Osteopenia 08/16/2016  . Acute pain of left foot 01/21/2015  . Left hip pain 03/06/2014  . Shoulder pain, bilateral 03/06/2014  . Pain in joint, ankle and foot 08/15/2013  . Right elbow pain 06/04/2013  . Medial epicondylitis of right elbow 06/04/2013  . Knee pain 06/05/2012  . Patellofemoral arthritis 06/05/2012   Mindy Juarez PT, DPT 03/20/17 5:24 PM   [place] [address] [place], [place], [address] Phone: [phone]   Fax:  [phone]  Name: Mindy Juarez MRN: [id number] Date of Birth: [birth year]

## 2017-03-22 ENCOUNTER — Encounter: Payer: BC Managed Care – PPO | Admitting: Physical Therapy

## 2017-03-22 ENCOUNTER — Ambulatory Visit: Payer: BC Managed Care – PPO | Admitting: Physical Therapy

## 2017-03-22 ENCOUNTER — Encounter: Payer: Self-pay | Admitting: Physical Therapy

## 2017-03-22 DIAGNOSIS — R293 Abnormal posture: Secondary | ICD-10-CM

## 2017-03-22 DIAGNOSIS — M25512 Pain in left shoulder: Secondary | ICD-10-CM | POA: Diagnosis not present

## 2017-03-22 DIAGNOSIS — G8929 Other chronic pain: Secondary | ICD-10-CM

## 2017-03-22 NOTE — Therapy (Signed)
[place], [place], [address] Phone: [phone]   Fax:  [phone]  Physical Therapy Treatment  Patient Details  Name: Mindy Juarez MRN: [id number] Date of Birth: April 25, 1955 Referring Provider: Dr. Micheline Chapman   Encounter Date: 03/22/2017  PT End of Session - 03/22/17 0815    Visit Number  6    Number of Visits  8    Date for PT Re-Evaluation  03/31/17    PT Start Time  0809    PT Stop Time  0848    PT Time Calculation (min)  39 min    Activity Tolerance  Patient tolerated treatment well    Behavior During Therapy  [facility] for tasks assessed/performed       Past Medical History:  Diagnosis Date  . Elevated liver enzymes 2018  . History of echocardiogram    Echo 10/18: EF 60-65, normal wall motion, normal diastolic function, mild LAE  . Osteoarthritis    +ANA; followed by Rheumatology (Deveshwar)  . Osteoporosis 05/16/2016  . Vitamin D deficiency     Past Surgical History:  Procedure Laterality Date  . CARPAL TUNNEL RELEASE Right 2009  . [place]  . HIP ARTHROSCOPY W/ LABRAL REPAIR Left 2007  . TRIGGER FINGER RELEASE     thumb    There were no vitals filed for this visit.  Subjective Assessment - 03/22/17 0812    Subjective  I'm feeling improvement.  I have been told I have tight pecs. Movement in general is better. Reaching behind and reaching across still bother me at times.  Discomfort.      Currently in Pain?  No/denies         [facility] Adult PT Treatment/Exercise - 03/22/17 0001      Shoulder Exercises: Standing   External Rotation  Strengthening;Both;10 reps    Row  Strengthening;Both;10 reps    Other Standing Exercises  wall exercises for scapular stabilization: isometric ER with forward flexion    Other Standing Exercises  blue loop iso horiz abd and overhead flexion x 10 , cues for maintaining back on wall in neutral       Shoulder Exercises: Stretch   Corner  Stretch  3 reps;30 seconds    Cross Chest Stretch  3 reps;30 seconds    Other Shoulder Stretches  supine foam roller for thoracic spine 3 areas x 5-6 each inhale to extend       Iontophoresis   Type of Iontophoresis  Dexamethasone    Location  L     Dose  1 cc     Time  6 hr patch       Manual Therapy   Joint Mobilization                PT Education - 03/22/17 1229    Education provided  Yes    Education Details  foam roller mobility    Person(s) Educated  Patient    Methods  Explanation;Verbal cues    Comprehension  Verbalized understanding;Returned demonstration          PT Long Term Goals - 03/22/17 0954      PT LONG TERM GOAL #1   Title  Pt will be I with HEP for scapular stabilization, posture    Status  On-going      PT LONG TERM GOAL #2   Title  Pt will score less than 30% on FOTO to demo increased functional mobility.  Status  On-going      PT LONG TERM GOAL #3   Title  Pt will be able to consistently reach across to opposite shoulder and complete upper body dressing.     Baseline  improving     Status  On-going      PT LONG TERM GOAL #4   Title  Pt will demo normal 4+/5 to 5/5 strength throughout L shoulder including external rotation.     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  Worked on thoracic mobility with foam roller.  Improving and more aware of what she needs to do at home to avoid aggravation of symptoms.     PT Treatment/Interventions  ADLs/Self Care Home Management;Electrical Stimulation;Neuromuscular re-education;Taping;Therapeutic activities;Iontophoresis 4mg /ml Dexamethasone;Cryotherapy;Ultrasound;Therapeutic exercise;Patient/family education;Manual  techniques;Moist Heat;Dry needling;Passive range of motion    PT Next Visit Plan  ionto PRN, scapular stabilization, continue to breakdown painful movements    PT Home Exercise Plan  ER and horizontal abduction, door pec stretch , post capsule sleeper, elbows on table UE ER     Consulted and Agree with Plan of Care  Patient       Patient will benefit from skilled therapeutic intervention in order to improve the following deficits and impairments:  Pain, Postural dysfunction, Impaired UE functional use, Increased fascial restricitons, Decreased strength, Decreased range of motion, Decreased mobility  Visit Diagnosis: Chronic left shoulder pain  Abnormal posture     Problem List Patient Active Problem List   Diagnosis Date Noted  . Elevated LFTs/ has been evaluated by Dr Collene Mares GI  09/08/2016  . Multiple joint pain 09/08/2016  . ANA positive 08/16/2016  . Osteopenia 08/16/2016  . Acute pain of left foot 01/21/2015  . Left hip pain 03/06/2014  . Shoulder pain, bilateral 03/06/2014  . Pain in joint, ankle and foot 08/15/2013  . Right elbow pain 06/04/2013  . Medial epicondylitis of right elbow 06/04/2013  . Knee pain 06/05/2012  . Patellofemoral arthritis 06/05/2012    PAA,JENNIFER 03/22/2017, 12:32 PM  [facility] [address] [place], [place], [address] Phone: [phone]   Fax:  [phone]  Name: PATRINA ANDREAS MRN: [id number] Date of Birth: 1955/09/29   Raeford Razor, PT 03/22/17 12:32 PM Phone: [phone] Fax: [phone]

## 2017-03-27 ENCOUNTER — Ambulatory Visit: Payer: BC Managed Care – PPO | Admitting: Physical Therapy

## 2017-03-27 ENCOUNTER — Encounter: Payer: Self-pay | Admitting: Physical Therapy

## 2017-03-27 DIAGNOSIS — R293 Abnormal posture: Secondary | ICD-10-CM

## 2017-03-27 DIAGNOSIS — G8929 Other chronic pain: Secondary | ICD-10-CM

## 2017-03-27 DIAGNOSIS — M25512 Pain in left shoulder: Principal | ICD-10-CM

## 2017-03-27 NOTE — Therapy (Signed)
[place], [place], [address] Phone: [phone]   Fax:  [phone]  Physical Therapy Treatment  Patient Details  Name: Mindy Juarez MRN: [id number] Date of Birth: September 15, 1955 Referring Provider: Dr. Micheline Chapman   Encounter Date: 03/27/2017  PT End of Session - 03/27/17 1434    Visit Number  7    Number of Visits  8    Date for PT Re-Evaluation  03/31/17    PT Start Time  1418    PT Stop Time  1500    PT Time Calculation (min)  42 min    Activity Tolerance  Patient tolerated treatment well    Behavior During Therapy  [facility] for tasks assessed/performed       Past Medical History:  Diagnosis Date  . Elevated liver enzymes 2018  . History of echocardiogram    Echo 10/18: EF 60-65, normal wall motion, normal diastolic function, mild LAE  . Osteoarthritis    +ANA; followed by Rheumatology (Deveshwar)  . Osteoporosis 05/16/2016  . Vitamin D deficiency     Past Surgical History:  Procedure Laterality Date  . CARPAL TUNNEL RELEASE Right 2009  . [place]  . HIP ARTHROSCOPY W/ LABRAL REPAIR Left 2007  . TRIGGER FINGER RELEASE     thumb    There were no vitals filed for this visit.  Subjective Assessment - 03/27/17 1428    Subjective  If I move really slowly I can take my shirt and bra off without pain.  No pain at rest. Tomorrow's workouts was posted and it is push ups what else should I do?     Currently in Pain?  No/denies          [facility] Adult PT Treatment/Exercise - 03/27/17 0001      Shoulder Exercises: Seated   External Rotation  Strengthening;Both;10 reps    External Rotation Weight (lbs)  yellow    Flexion  Strengthening;Both;10 reps    Flexion Limitations  with abd on band, from table  yellow    Other Seated Exercises  bilat UE ER yellow tband, elbows in hte air, flexed, 90 deg       Shoulder Exercises: Prone   Retraction  Strengthening;Both;10 reps    Extension   Strengthening;Left;Both;10 reps    External Rotation  Strengthening;Both;10 reps    Internal Rotation  Strengthening;Both;10 reps      Shoulder Exercises: ROM/Strengthening   UBE (Upper Arm Bike)  level 1 in reverse cues for posture       Iontophoresis   Type of Iontophoresis  Dexamethasone    Location  L     Dose  1 cc     Time  6 hr patch          Pilates Reformer used for LE/core strength, postural strength, lumbopelvic disassociation and core control.  Exercises included:  Long box Prone 1 Red , double wide, narrow and single arm x 10 each  Very tired, fatigued but no pain.  Focus on scapular stability.   Standing cat stretch arms on box forward flexion and spine stretching x 3      PT Education - 03/27/17 1433    Education provided  Yes    Education Details  exercise modifcations, prone , POC     Person(s) Educated  Patient    Methods  Explanation    Comprehension  Verbalized understanding          PT Long Term  Goals - 03/27/17 1435      PT LONG TERM GOAL #1   Title  Pt will be I with HEP for scapular stabilization, posture    Status  On-going      PT LONG TERM GOAL #2   Title  Pt will score less than 30% on FOTO to demo increased functional mobility.     Status  On-going      PT LONG TERM GOAL #3   Title  Pt will be able to consistently reach across to opposite shoulder and complete upper body dressing.     Baseline  can do slowly    Status  Partially Met      PT LONG TERM GOAL #4   Title  Pt will demo normal 4+/5 to 5/5 strength throughout L shoulder including external rotation.       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  Knows HEP. Renewal next for continued  with dry needling.      PT Next Visit Plan  ionto PRN, scapular stabilization, FOTO and renew. continue to breakdown painful movements    PT Home Exercise Plan  ER and horizontal abduction, door pec stretch , post capsule sleeper, elbows on table UE ER     Consulted and Agree with Plan of Care  Patient       Patient will benefit from skilled therapeutic intervention in order to improve the following deficits and impairments:  Pain, Postural dysfunction, Impaired UE functional use, Increased fascial restricitons, Decreased strength, Decreased range of motion, Decreased mobility  Visit Diagnosis: Chronic left shoulder pain  Abnormal posture     Problem List Patient Active Problem List   Diagnosis Date Noted  . Elevated LFTs/ has been evaluated by Dr Collene Mares GI  09/08/2016  . Multiple joint pain 09/08/2016  . ANA positive 08/16/2016  . Osteopenia 08/16/2016  . Acute pain of left foot 01/21/2015  . Left hip pain 03/06/2014  . Shoulder pain, bilateral 03/06/2014  . Pain in joint, ankle and foot 08/15/2013  . Right elbow pain 06/04/2013  . Medial epicondylitis of right elbow 06/04/2013  . Knee pain 06/05/2012  . Patellofemoral arthritis 06/05/2012    PAA,JENNIFER 03/27/2017, 3:13 PM  [facility] [address] [place], [place], [address] Phone: [phone]   Fax:  [phone]  Name: Mindy Juarez MRN: [id number] Date of Birth: [birth year]  Raeford Razor, PT 03/27/17 3:13 PM Phone: [phone] Fax: [phone]

## 2017-03-29 ENCOUNTER — Ambulatory Visit: Payer: BC Managed Care – PPO | Admitting: Physical Therapy

## 2017-03-29 DIAGNOSIS — G8929 Other chronic pain: Secondary | ICD-10-CM

## 2017-03-29 DIAGNOSIS — R293 Abnormal posture: Secondary | ICD-10-CM

## 2017-03-29 DIAGNOSIS — M25512 Pain in left shoulder: Secondary | ICD-10-CM | POA: Diagnosis not present

## 2017-03-29 NOTE — Therapy (Addendum)
[place], [place], [address] Phone: [phone]   Fax:  [phone]  Physical Therapy Treatment/Renewal/Addended DC  Patient Details  Name: Mindy Juarez MRN: [id number] Date of Birth: 24-Feb-1955 Referring Provider: Dr. Micheline Chapman   Encounter Date: 03/29/2017  PT End of Session - 03/29/17 1439    Visit Number  8    Number of Visits  10    Date for PT Re-Evaluation  04/12/17    PT Start Time  7588    PT Stop Time  1520    PT Time Calculation (min)  42 min    Activity Tolerance  Patient tolerated treatment well    Behavior During Therapy  [facility] for tasks assessed/performed       Past Medical History:  Diagnosis Date  . Elevated liver enzymes 2018  . History of echocardiogram    Echo 10/18: EF 60-65, normal wall motion, normal diastolic function, mild LAE  . Osteoarthritis    +ANA; followed by Rheumatology (Deveshwar)  . Osteoporosis 05/16/2016  . Vitamin D deficiency     Past Surgical History:  Procedure Laterality Date  . CARPAL TUNNEL RELEASE Right 2009  . [place]  . HIP ARTHROSCOPY W/ LABRAL REPAIR Left 2007  . TRIGGER FINGER RELEASE     thumb    There were no vitals filed for this visit.  Subjective Assessment - 03/29/17 1445    Subjective  Was really sore Monday PM and Tues.  Arrived a bit late.      Currently in Pain?  No/denies         [facility] PT Assessment - 03/29/17 0001      Observation/Other Assessments   Focus on Therapeutic Outcomes (FOTO)   35%      Strength   Left Shoulder External Rotation  4/5        [facility] Adult PT Treatment/Exercise - 03/29/17 0001      Shoulder Exercises: Supine   Other Supine Exercises  thoracic mobility  3 levels, x 5 extension       Shoulder Exercises: Prone   Retraction  Strengthening    Horizontal ABduction 1  Strengthening;Both;10 reps    Other Prone Exercises  prone on Reformer pulling straps 1 Red extension,  T  press too uncomfortable       Shoulder Exercises: Stretch   Corner Stretch  3 reps;30 seconds variable angle       Manual Therapy   Manual therapy comments  pin and stretch to Rt. pec, , PROM     Soft tissue mobilization  pectoralis major and minor R>L, Rt. upper traps  and R.t levator, extremely knotted and tight     Scapular Mobilization  Rt. scap              PT Education - 03/29/17 1524    Education provided  Yes    Education Details  revese plank, pec stretching     Person(s) Educated  Patient    Methods  Explanation    Comprehension  Verbalized understanding          PT Long Term Goals - 03/29/17 1525      PT LONG TERM GOAL #1   Title  Pt will be I with HEP for scapular stabilization, posture    Baseline  up to date     Status  Achieved      PT LONG TERM GOAL #2   Title  Pt will score  less than 30% on FOTO to demo increased functional mobility.     Baseline  35%    Status  On-going      PT LONG TERM GOAL #3   Title  Pt will be able to consistently reach across to opposite shoulder and complete upper body dressing.     Baseline  can do slowly    Status  Partially Met      PT LONG TERM GOAL #4   Title  Pt will demo normal 4+/5 to 5/5 strength throughout L shoulder including external rotation.     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  Reduced Rt. scapular protraction post manual. Will benefit from continued PT for dry needling.     PT Treatment/Interventions  ADLs/Self Care Home Management;Electrical Stimulation;Neuromuscular re-education;Taping;Therapeutic activities;Iontophoresis  60m/ml Dexamethasone;Cryotherapy;Ultrasound;Therapeutic exercise;Patient/family education;Manual techniques;Moist Heat;Dry needling;Passive range of motion    PT Next Visit Plan  scapular stabilization,. continue to breakdown painful movements, dry needle UT, LS and pec     PT Home Exercise Plan  ER and horizontal abduction, door pec stretch , post capsule sleeper, elbows on table UE ER        Patient will benefit from skilled therapeutic intervention in order to improve the following deficits and impairments:  Pain, Postural dysfunction, Impaired UE functional use, Increased fascial restricitons, Decreased strength, Decreased range of motion, Decreased mobility  Visit Diagnosis: Abnormal posture  Chronic left shoulder pain     Problem List Patient Active Problem List   Diagnosis Date Noted  . Elevated LFTs/ has been evaluated by Dr MCollene MaresGI  09/08/2016  . Multiple joint pain 09/08/2016  . ANA positive 08/16/2016  . Osteopenia 08/16/2016  . Acute pain of left foot 01/21/2015  . Left hip pain 03/06/2014  . Shoulder pain, bilateral 03/06/2014  . Pain in joint, ankle and foot 08/15/2013  . Right elbow pain 06/04/2013  . Medial epicondylitis of right elbow 06/04/2013  . Knee pain 06/05/2012  . Patellofemoral arthritis 06/05/2012    Mindy Juarez 03/29/2017, 3:29 PM  [facility] [address] [address]Phone: [phone]  Fax:  [phone] Name: Mindy WUELLNERMRN: [id number] of Birth: [birth year] JRaeford Razor PT 03/29/17 3:29 PM Phone: [phone]: [phone] PHYSICAL THERAPY DISCHARGE SUMMARY  Visits from Start of Care: 8  Current functional level related to goals / functional outcomes: See above    Remaining deficits: See above   Education / Equipment: HEP, posture, see above  Plan: Patient agrees to discharge.  Patient goals were partially met. Patient is being discharged due  to financial reasons.  ?????    Patient not pleased with a bill she received and wanted to be discharged. Spoke at length on the phone.  JRaeford Razor PT 04/13/17 11:52 AM Phone: [phone]: [phone]

## 2017-03-30 ENCOUNTER — Ambulatory Visit (INDEPENDENT_AMBULATORY_CARE_PROVIDER_SITE_OTHER): Payer: BC Managed Care – PPO | Admitting: Sports Medicine

## 2017-03-30 DIAGNOSIS — M67912 Unspecified disorder of synovium and tendon, left shoulder: Secondary | ICD-10-CM

## 2017-03-30 NOTE — Progress Notes (Signed)
   Subjective:    Patient ID: Mindy Juarez, female    DOB: 11-Aug-1955, 62 y.o.   MRN: [id number]  HPI  Mindy Juarez is a 62yo F presenting for follow up of L shoulder pain 2/2 likely rotator cuff tendinopathy.   Patient last seen at sports medicine clinic for this issue on 02/16/17. Was diagnosed with rotator cuff tendinopathy and referred to PT. Patient also completed 7d course of naproxen while awaiting PT.  Today, patient says her symptoms are about 50% better than at last appt, however it has been a slow process and she is getting a bit frustrated. She says she is able to do more than she was at last visit (example: now she is able to take off clothes like her sports bra) but it is still uncomfortable. Thinks PT has been very helpful, but last session was yesterday. Has had two dry needling sessions already and has two more scheduled. She has still been going to the gym and doing her regular pilates. Reports aching in her shoulders first thing in the morning but she is able to sleep well at night. Has not taken naproxen since initial 7d course but says it was very helpful then.    Review of Systems MSK: Denies numbness, tingling of LUE    Objective:   Physical Exam  Constitutional: She is oriented to person, place, and time. She appears well-developed and well-nourished.  HENT:  Head: Normocephalic and atraumatic.  Musculoskeletal:  RUE: Mild weakness with external rotation. Positive empty can. Limited ROM when reaching behind back, though ROM otherwise WNL. No TTP of R shoulder or UE.   Neurological: She is alert and oriented to person, place, and time.  Psychiatric: She has a normal mood and affect. Her behavior is normal.      Assessment & Plan:  Tendinopathy of left rotator cuff Improving after conservative management with physical therapy, however not back to baseline functionality. Physical exam improved as well, as patient with more strength and greater ROM than at last  visit. Will order two additional PT sessions, then have physical therapist evaluate need for further sessions. Will continue with two additional dry needling sessions that have already been scheduled. Can begin another 7d trial of naproxen.  F/u in 3 weeks, or sooner if not improving. Can consider shoulder injection and possibly further imaging at that time if patient is failing conservative management.   Mindy Hector, MD, MPH PGY-3 [facility] Pager [phone]  Patient seen and evaluated with the resident. I agree with the above plan of care. Patient is making slow but steady progress in physical therapy. She had excellent pain relief with her naproxen sodium so I recommended an additional 7 days. I also explained to her that we could inject her subacromial space with cortisone if her symptoms plateau. Follow-up with me in 3 weeks for reevaluation. Continue with PT in the meantime. Follow-up sooner if needed.

## 2017-03-30 NOTE — Assessment & Plan Note (Signed)
Improving after conservative management with physical therapy, however not back to baseline functionality. Physical exam improved as well, as patient with more strength and greater ROM than at last visit. Will order two additional PT sessions, then have physical therapist evaluate need for further sessions. Will continue with two additional dry needling sessions that have already been scheduled. Can begin another 7d trial of naproxen.  F/u in 3 weeks, or sooner if not improving. Can consider shoulder injection and possibly further imaging at that time if patient is failing conservative management.

## 2017-04-04 ENCOUNTER — Encounter: Payer: BC Managed Care – PPO | Admitting: Physical Therapy

## 2017-04-05 ENCOUNTER — Encounter: Payer: Self-pay | Admitting: Physical Therapy

## 2017-04-11 ENCOUNTER — Encounter: Payer: BC Managed Care – PPO | Admitting: Physical Therapy

## 2017-04-12 ENCOUNTER — Encounter: Payer: Self-pay | Admitting: Physical Therapy

## 2017-04-14 ENCOUNTER — Ambulatory Visit: Payer: BC Managed Care – PPO | Admitting: Physical Therapy

## 2017-04-20 ENCOUNTER — Encounter: Payer: Self-pay | Admitting: Sports Medicine

## 2017-04-20 ENCOUNTER — Ambulatory Visit: Payer: BC Managed Care – PPO | Admitting: Sports Medicine

## 2017-04-20 VITALS — BP 120/72 | Ht 66.0 in | Wt 150.0 lb

## 2017-04-20 DIAGNOSIS — M67912 Unspecified disorder of synovium and tendon, left shoulder: Secondary | ICD-10-CM

## 2017-04-20 NOTE — Progress Notes (Signed)
   HPI  Mindy Juarez is a 62 y/o woman here today in follow up for left rotator cuff tendinopathy. She was seen previously on 1/10 and 2/21 for this problem and participated with physical therapy and short courses of oral NSAIDs. She was recommended for repeated sessions of PT and dry needling since February but did not resume these until earlier this week due to financial dispute because of insurance coverage for recent [facility] PT sessions. Overall this pain no longer wakes her up at night and continues to slowly improve with home exercises and with resuming therapy.  CC: Follow up for left shoulder pain   Medications/Interventions Tried: PT, naproxen  See HPI and/or previous note for associated ROS.  Objective: BP 120/72   Ht 5\' 6"  (1.676 m)   Wt 150 lb (68 kg)   LMP 11/08/2006 (Approximate)   BMI 24.21 kg/m  Gen: NAD, well groomed, normal affect.  CV: Well-perfused. Radial pulse 2+, normal capillary refill. Resp: Non-labored.  Neuro: Sensation intact throughout both upper extremities, 2+ triceps and brachioradialis reflexes MSK: Left shoulder with exercise tape in place, no swelling or erythema, strength is intact and symmetric, ROM is intact and symmetric in abduction, external rotation, and extension, Negative painful arc and empty can, some pain with external rotation and extension against resistance.  Assessment and plan:  Tendinopathy of left rotator cuff Her strength and range of motion are very good today symmetric with the right shoulder. She demonstrates continuing although slow progress with pain. She had a delay of about 3 weeks in resuming her therapy sessions but has continued to improve despite this with home exercises. She feels comfortable continuing the current plan. She will see PT the next 3 weeks, pilates, and exercises. She can follow up PRN at this point, if progress plateaus or she starts to have new worsening.   No orders of the defined types were placed in  this encounter.   No orders of the defined types were placed in this encounter.   Collier Salina, MD PGY-III Internal Medicine Resident 04/20/2017, 10:22 AM   Patient seen and evaluated with the resident. I agree with the above plan of care. Patient continues to improve. At this point in time I elected to wait on any further workup or treatment. She may continue to work with physical therapy as needed and will follow-up with me prn.

## 2017-04-20 NOTE — Assessment & Plan Note (Addendum)
Her strength and range of motion are very good today symmetric with the right shoulder. She demonstrates continuing although slow progress with pain. She had a delay of about 3 weeks in resuming her therapy sessions but has continued to improve despite this with home exercises. She feels comfortable continuing the current plan. She will see PT the next 3 weeks, pilates, and exercises. She can follow up PRN at this point, if progress plateaus or she starts to have new worsening.

## 2017-04-21 ENCOUNTER — Encounter: Payer: Self-pay | Admitting: Sports Medicine

## 2017-06-02 ENCOUNTER — Encounter: Payer: Self-pay | Admitting: Sports Medicine

## 2017-06-02 ENCOUNTER — Ambulatory Visit: Payer: Self-pay

## 2017-06-02 ENCOUNTER — Ambulatory Visit
Admission: RE | Admit: 2017-06-02 | Discharge: 2017-06-02 | Disposition: A | Discharge status: Home / Self Care | Payer: BC Managed Care – PPO | Source: Ambulatory Visit | Attending: Sports Medicine | Admitting: Sports Medicine

## 2017-06-02 ENCOUNTER — Ambulatory Visit (INDEPENDENT_AMBULATORY_CARE_PROVIDER_SITE_OTHER): Payer: BC Managed Care – PPO | Admitting: Sports Medicine

## 2017-06-02 VITALS — BP 141/72 | HR 53 | Ht 66.0 in | Wt 147.0 lb

## 2017-06-02 DIAGNOSIS — M25512 Pain in left shoulder: Secondary | ICD-10-CM | POA: Diagnosis not present

## 2017-06-02 DIAGNOSIS — M67912 Unspecified disorder of synovium and tendon, left shoulder: Secondary | ICD-10-CM

## 2017-06-02 NOTE — Assessment & Plan Note (Signed)
Patient is here with signs and symptoms consistent with a subacute supraspinatus tendinitis.  Ultrasound obtained today show no evidence of intratendinous tears.  Patient reports plateauing of progress within physical therapy.  We discussed possibility of more aggressive imaging to assess her symptoms. -Shoulder x-rays ordered -Discussed obtaining MRI/MRA >> patient to call around to assess pricing >> patient to call if she finds a reasonable site she is willing to go to. -Continue PT -F/u PRN, or after MRI if obtained

## 2017-06-02 NOTE — Progress Notes (Addendum)
HPI  CC: Left shoulder pain Patient is here to follow-up regarding her left-sided shoulder pain.  She has been previously diagnosed with left-sided rotator cuff tendinopathy.  Patient has been undergoing physical therapy over the past few months.  She states that she had significant benefits over the first handful of visits but states that since that time she feels as though her progress has "plateaued".  She denies any new injury, trauma, or events which may have set her back.  No weakness, numbness, or paresthesias.  Range of motion does not seem to have changed.  Patient is looking for additional interventions which may help her continue her progress towards recovery.  Medications/Interventions Tried: Home exercise program, formal physical therapy, naproxen  See HPI and/or previous note for associated ROS.  Objective: BP (!) 141/72   Pulse (!) 53   Ht 5\' 6"  (1.676 m)   Wt 147 lb (66.7 kg)   LMP 11/08/2006 (Approximate)   BMI 23.73 kg/m  Gen: NAD, well groomed, a/o x3, normal affect.  CV: Well-perfused. Warm.  Resp: Non-labored.  Neuro: Sensation intact throughout. No gross coordination deficits.  Gait: Nonpathologic posture, unremarkable stride without signs of limp or balance issues. Shoulder, Left: TTP noted at the posterior lateral aspect of the shoulder without radiation. No evidence of bony deformity, asymmetry, or muscle atrophy; No tenderness over long head of biceps (bicipital groove). Minimal TTP at [facility] joint. Full active and passive range of motion -- with discomfort at the extreme ranges of forward flexion and abduction. Strength 5/5 throughout. No abnormal scapular function observed. Sensation intact. Peripheral pulses intact.  Special Tests:   - Crossarm test: NEG   - Empty can: Positive   - Hawkins: Positive   - Obrien's test: NEG   - Yergason's: NEG   - Speeds test: NEG  ULTRASOUND: Shoulder, Left  Diagnostic complete ultrasound imaging obtained of patient's left  shoulder.  - No obvious evidence of bony deformity or osteophyte development appreciated.  - Long head of the biceps tendon: No evidence of tendon thickening, calcification, subluxation, or tearing in short or long axis views. No edema or bullseye sign.  - Subscapularis tendon: complete visualization across the width of the insertion point yielded no evidence of tendon thickening, calcification, or tears in the long axis view.  - Supraspinatus tendon: complete visualization across the width of the insertion point yielded mild to moderate intra-tenderness fluid with very fine microcalcifications.  No evidence of tearing or obvious bleeding. No significant evidence of bursal inflammation appreciated.  - Infraspinatus and teres minor tendons: visualization across the width of the insertion points yielded no evidence of tendon thickening, calcification, or tears in the long axis view.  [facility] Joint: Mild space narrowing with initial evidence of osteophyte development and slightly increased fluid accumulation.   IMPRESSION: findings consistent with subacute supraspinatus tendinitis and mild AC osteoarthritis..   Assessment and plan:  Tendinopathy of left rotator cuff Patient is here with signs and symptoms consistent with a subacute supraspinatus tendinitis.  Ultrasound obtained today show no evidence of intratendinous tears.  Patient reports plateauing of progress within physical therapy.  We discussed possibility of more aggressive imaging to assess her symptoms. -Shoulder x-rays ordered -Discussed obtaining MRI/MRA >> patient to call around to assess pricing >> patient to call if she finds a reasonable site she is willing to go to. -Continue PT -F/u PRN, or after MRI if obtained   Orders Placed This Encounter  Procedures  . [place] COMPLETE JOINT SPACE  STRUCTURE UP LEFT    Standing Status:   Future    Number of Occurrences:   1    Standing Expiration Date:   08/02/2018    Order Specific Question:    Reason for Exam (SYMPTOM  OR DIAGNOSIS REQUIRED)    Answer:   left shoulder pain    Order Specific Question:   Preferred imaging location?    Answer:   Internal  . DG Shoulder Left    Standing Status:   Future    Number of Occurrences:   1    Standing Expiration Date:   08/03/2018    Order Specific Question:   Reason for Exam (SYMPTOM  OR DIAGNOSIS REQUIRED)    Answer:   left shoulder pain    Order Specific Question:   Preferred imaging location?    Answer:   [facility]    Order Specific Question:   Radiology Contrast Protocol - do NOT remove file path    Answer:   [email or web address]    Elberta Leatherwood, MD,MS [facility] Fellow 06/02/2017 12:14 PM   Patient seen and evaluated. I agree with the above plan of care. Ultrasound is fairly unremarkable. I recommended further workup with x-ray and MRI. Patient's pain is worsening despite physical therapy. Phone follow-up with those results when available. We'll delineate further treatment based on those findings.  Addendum (06/04/2017): X-rays reviewed. They're unremarkable.

## 2017-06-13 ENCOUNTER — Ambulatory Visit: Payer: BC Managed Care – PPO | Admitting: Nurse Practitioner

## 2017-06-14 NOTE — Addendum Note (Signed)
Addended by: Cyd Silence on: 06/14/2017 02:57 PM   Modules accepted: Orders

## 2017-06-16 ENCOUNTER — Ambulatory Visit: Payer: BC Managed Care – PPO | Admitting: Obstetrics and Gynecology

## 2017-06-22 ENCOUNTER — Ambulatory Visit
Admission: RE | Admit: 2017-06-22 | Discharge: 2017-06-22 | Disposition: A | Discharge status: Home / Self Care | Payer: BC Managed Care – PPO | Source: Ambulatory Visit | Attending: Sports Medicine | Admitting: Sports Medicine

## 2017-06-22 DIAGNOSIS — M25512 Pain in left shoulder: Secondary | ICD-10-CM

## 2017-06-27 ENCOUNTER — Telehealth: Payer: Self-pay | Admitting: Sports Medicine

## 2017-06-27 DIAGNOSIS — M67912 Unspecified disorder of synovium and tendon, left shoulder: Secondary | ICD-10-CM

## 2017-06-27 NOTE — Telephone Encounter (Signed)
  I spoke with the patient on the phone today after reviewing the MRI of her left shoulder. She has severe tendinosis of the supraspinatus without a discrete tear. She also has some moderate bursitis. I discussed treatment options including continuing with formal PT, subacromial cortisone injection, and surgical referral. Patient has decided to continue with formal physical therapy. She is trying to decide between Guido Sander and Barbaraann Barthel. I've asked her to follow-up with me 4 weeks after her resumption of PT. Patient is in agreement with that plan.

## 2017-07-04 NOTE — Addendum Note (Signed)
Addended by: Jolinda Croak E on: 07/04/2017 09:20 AM   Modules accepted: Orders

## 2017-07-14 ENCOUNTER — Encounter: Payer: Self-pay | Admitting: Physical Therapy

## 2017-07-14 ENCOUNTER — Ambulatory Visit: Payer: BC Managed Care – PPO | Attending: Sports Medicine | Admitting: Physical Therapy

## 2017-07-14 DIAGNOSIS — G8929 Other chronic pain: Secondary | ICD-10-CM | POA: Insufficient documentation

## 2017-07-14 DIAGNOSIS — M25512 Pain in left shoulder: Secondary | ICD-10-CM | POA: Diagnosis present

## 2017-07-14 DIAGNOSIS — R293 Abnormal posture: Secondary | ICD-10-CM | POA: Diagnosis present

## 2017-07-14 NOTE — Therapy (Signed)
[place], [place], [address] Phone: [phone]   Fax:  [phone]  Physical Therapy Evaluation  Patient Details  Name: Mindy Juarez MRN: [id number] Date of Birth: [birth year] Referring Provider: Dr. Micheline Chapman    Encounter Date: 07/14/2017  PT End of Session - 07/14/17 1134    Visit Number  1    Number of Visits  6    Date for PT Re-Evaluation  09/01/17    PT Start Time  1103    PT Stop Time  1200    PT Time Calculation (min)  57 min    Activity Tolerance  Patient tolerated treatment well    Behavior During Therapy  [facility] for tasks assessed/performed       Past Medical History:  Diagnosis Date  . Elevated liver enzymes 2018  . History of echocardiogram    Echo 10/18: EF 60-65, normal wall motion, normal diastolic function, mild LAE  . Osteoarthritis    +ANA; followed by Rheumatology (Deveshwar)  . Osteoporosis 05/16/2016  . Vitamin D deficiency     Past Surgical History:  Procedure Laterality Date  . CARPAL TUNNEL RELEASE Right 2009  . [place]  . HIP ARTHROSCOPY W/ LABRAL REPAIR Left 2007  . TRIGGER FINGER RELEASE     thumb    There were no vitals filed for this visit.   Subjective Assessment - 07/14/17 1107    Subjective  Pt returns to PT for similar issue.  She finished PT with [facility] in April, did more PT and dry needling.   She was found to have rotator cuff tendinopathy.  Her pain is intermittent and recently more intense this week.  She is exercising with trainer 3 days per week.  She is diligent about exercises.  She stretches daily and does Pilates.  She would like to continue to see if conservative treatment can further her functional mobility and pain.      Pertinent History  osteoporosis, Shoulder pain R/L    Limitations  Lifting    Diagnostic tests  [place]  subacute supraspinatus tendinitis and mild AC osteoarthritis.  MRI severe supraspinatus tendinopathy and mod  infraspinatus     Patient Stated Goals  [place] get on the river, wash windows    Currently in Pain?  Yes    Pain Score  4     Pain Location  Shoulder    Pain Orientation  Left    Pain Descriptors / Indicators  Aching    Pain Type  Chronic pain    Pain Onset  More than a month ago    Pain Frequency  Intermittent    Aggravating Factors   hard to anticipate, more dynamic exercises     Pain Relieving Factors  meds, rest, heat, avoiding aggravating motions     Effect of Pain on Daily Activities  has to modify workouts, doffing shirts          [facility] PT Assessment - 07/14/17 0001      Assessment   Medical Diagnosis  L shoulder tendinopathy     Referring Provider  Dr. Micheline Chapman     Onset Date/Surgical Date  -- chronic     Hand Dominance  Right    Prior Therapy  YES       Precautions   Precautions  None      Restrictions   Weight Bearing Restrictions  No      Balance Screen  Has the patient fallen in the past 6 months  No      [place] residence      Prior Function   Level of Independence  Independent    Vocation  Full time employment academic coach     Vocation Requirements  carrying items     Leisure  fitness       Cognition   Overall Cognitive Status  Within Functional Limits for tasks assessed      Observation/Other Assessments   Focus on Therapeutic Outcomes (FOTO)   41%      Sensation   Light Touch  Appears Intact      Posture/Postural Control   Posture Comments  WNL       AROM   Left Shoulder Flexion  130 Degrees    Left Shoulder ABduction  135 Degrees painful arc     Left Shoulder Internal Rotation  70 Degrees FR to WNL pain     Left Shoulder External Rotation  75 Degrees FR to WNL       PROM   Overall PROM Comments  end range pain Abd, and ER combined       Strength   Left Shoulder Flexion  4-/5    Left Shoulder ABduction  4/5    Left Shoulder Internal Rotation  4+/5    Left Shoulder External Rotation  3+/5  pain       Palpation   Palpation comment  supraspinatus tender, sore with deep palpation      Special Tests   Other special tests  painful arc, pos empty can                 Objective measurements completed on examination: See above findings.      [place] Adult PT Treatment/Exercise - 07/14/17 0001      Cryotherapy   Number Minutes Cryotherapy  8 Minutes    Cryotherapy Location  Shoulder    Type of Cryotherapy  Ice pack      Ultrasound   Ultrasound Location  L supraspinatus    Ultrasound Parameters  100%, 1.5 W/cm2, 1 MHz    Ultrasound Goals  Pain             PT Education - 07/14/17 1155    Education Details  PT, POC episode, [place], ice for pain anf inflammation    Person(s) Educated  Patient    Methods  Explanation    Comprehension  Verbalized understanding;Returned demonstration          PT Long Term Goals - 07/14/17 1118      PT LONG TERM GOAL #1   Title  Pt will be I with HEP for scapular stabilization, posture    Time  6    Period  Weeks    Status  New    Target Date  09/01/17      PT LONG TERM GOAL #2   Title  Pt will score less than 35% on FOTO to demo increased functional mobility.     Time  6    Period  Weeks    Status  New    Target Date  09/01/17      PT LONG TERM GOAL #3   Title  Pt will be able to consistently reach across to opposite shoulder and complete upper body dressing with occ mild discomfort     Time  6    Period  Weeks    Status  New    Target Date  09/01/17      PT LONG TERM GOAL #4   Title  Pt will demo normal 4+/5 to 5/5 strength throughout L shoulder including external rotation.     Time  6    Period  Weeks    Status  New    Target Date  09/01/17      PT LONG TERM GOAL #5   Title  Pt will be able to wash windows in her home with min increase in pain.     Time  6    Period  Weeks    Status  New    Target Date  09/01/17             Plan - 07/14/17 1158    Clinical Impression Statement  Patient here  for low complexity eval of L shoulder tendinopathy.  She is conscientious and motivated to avoid surgery, maximize shoulder function.  She has min weakness in ER and abduction.  Painful arc and limitations in lifting, reaching for home, ADLs. She will have 4-6 visits as she has mentioned a plateau of function.  She may gain more function with PT intervention but may also just need to accept a mild degree of pain unless she opts for surgical intervention.     Clinical Presentation  Stable    Clinical Decision Making  Low    Rehab Potential  Excellent    PT Frequency  1x / week    PT Duration  6 weeks 4-6 weeks     PT Treatment/Interventions  ADLs/Self Care Home Management;Electrical Stimulation;Functional mobility training;Iontophoresis 4mg /ml Dexamethasone;Moist Heat;Ultrasound;Cryotherapy;Therapeutic exercise;Patient/family education;Manual techniques;Dry needling;Taping;Therapeutic activities;Neuromuscular re-education;Passive range of motion    PT Next Visit Plan  pt to bring in her HEP, try [place], tape, lower trap activation     PT Home Exercise Plan  has from previous , use ICE     Consulted and Agree with Plan of Care  Patient       Patient will benefit from skilled therapeutic intervention in order to improve the following deficits and impairments:  Decreased range of motion, Decreased strength, Impaired flexibility, Pain, Impaired UE functional use  Visit Diagnosis: Abnormal posture  Chronic left shoulder pain     Problem List Patient Active Problem List   Diagnosis Date Noted  . Tendinopathy of left rotator cuff 03/30/2017  . Elevated LFTs/ has been evaluated by Dr Collene Mares GI  09/08/2016  . Multiple joint pain 09/08/2016  . ANA positive 08/16/2016  . Osteopenia 08/16/2016  . Acute pain of left foot 01/21/2015  . Left hip pain 03/06/2014  . Shoulder pain, bilateral 03/06/2014  . Pain in joint, ankle and foot 08/15/2013  . Right elbow pain 06/04/2013  . Medial epicondylitis of  right elbow 06/04/2013  . Knee pain 06/05/2012  . Patellofemoral arthritis 06/05/2012    Kwesi Sangha 07/14/2017, 12:16 PM  [facility] [address] [place], [place], [address] Phone: [phone]   Fax:  [phone]  Name: Mindy Juarez MRN: [id number] Date of Birth: 04/15/1955  Raeford Razor, PT 07/14/17 12:17 PM Phone: [phone] Fax: [phone]

## 2017-07-20 ENCOUNTER — Encounter: Payer: Self-pay | Admitting: Physical Therapy

## 2017-07-20 ENCOUNTER — Ambulatory Visit: Payer: BC Managed Care – PPO | Admitting: Physical Therapy

## 2017-07-20 DIAGNOSIS — G8929 Other chronic pain: Secondary | ICD-10-CM

## 2017-07-20 DIAGNOSIS — R293 Abnormal posture: Secondary | ICD-10-CM | POA: Diagnosis not present

## 2017-07-20 DIAGNOSIS — M25512 Pain in left shoulder: Secondary | ICD-10-CM

## 2017-07-20 NOTE — Therapy (Signed)
[place], [place], [address] Phone: [phone]   Fax:  [phone]  Physical Therapy Treatment  Patient Details  Name: Mindy Juarez MRN: [id number] Date of Birth: [birth year] Referring Provider: Dr. Micheline Chapman    Encounter Date: 07/20/2017  PT End of Session - 07/20/17 1031    Visit Number  2    Number of Visits  6    Date for PT Re-Evaluation  09/01/17    PT Start Time  1024    PT Stop Time  1100    PT Time Calculation (min)  36 min    Activity Tolerance  Patient tolerated treatment well    Behavior During Therapy  [facility] for tasks assessed/performed       Past Medical History:  Diagnosis Date  . Elevated liver enzymes 2018  . History of echocardiogram    Echo 10/18: EF 60-65, normal wall motion, normal diastolic function, mild LAE  . Osteoarthritis    +ANA; followed by Rheumatology (Deveshwar)  . Osteoporosis 05/16/2016  . Vitamin D deficiency     Past Surgical History:  Procedure Laterality Date  . CARPAL TUNNEL RELEASE Right 2009  . [place]  . HIP ARTHROSCOPY W/ LABRAL REPAIR Left 2007  . TRIGGER FINGER RELEASE     thumb    There were no vitals filed for this visit.  Subjective Assessment - 07/20/17 1029    Subjective  I brought in all my exercises.  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      Currently in Pain?  No/denies         [facility] Adult PT Treatment/Exercise - 07/20/17 0001      Self-Care   Self-Care  Other Self-Care Comments    Other Self-Care Comments   HEP current and how she modifies      Shoulder Exercises: Supine   Horizontal ABduction  Strengthening    External Rotation  Strengthening      Shoulder Exercises: Prone   Retraction  Strengthening;Both;10 reps    Flexion  Strengthening;Left;10 reps    Extension  Strengthening;Both;10 reps    External Rotation  Strengthening;Left;10 reps    Internal Rotation  Strengthening;Left;10 reps     Horizontal ABduction 1  Strengthening;Both;10 reps    Horizontal ABduction 2  Strengthening;Left;10 reps    Other Prone Exercises  row 8 lbs x 10       Shoulder Exercises: ROM/Strengthening   UBE (Upper Arm Bike)  5 min L1       Manual Therapy   Manual Therapy  Soft tissue mobilization;Myofascial release;Scapular mobilization    Soft tissue mobilization  subscapularis, IASTM to lower trap and teres , post cuff     Myofascial Release  L scapula     Scapular Mobilization  prone and sidelying              PT Education - 07/20/17 1058    Education Details  scapular mobs, lower trap activation    Person(s) Educated  Patient    Methods  Explanation    Comprehension  Verbalized understanding;Returned demonstration          PT Long Term Goals - 07/14/17 1118      PT LONG TERM GOAL #1   Title  Pt will be I with HEP for scapular stabilization, posture    Time  6    Period  Weeks    Status  New    Target  Date  09/01/17      PT LONG TERM GOAL #2   Title  Pt will score less than 35% on FOTO to demo increased functional mobility.     Time  6    Period  Weeks    Status  New    Target Date  09/01/17      PT LONG TERM GOAL #3   Title  Pt will be able to consistently reach across to opposite shoulder and complete upper body dressing with occ mild discomfort     Time  6    Period  Weeks    Status  New    Target Date  09/01/17      PT LONG TERM GOAL #4   Title  Pt will demo normal 4+/5 to 5/5 strength throughout L shoulder including external rotation.     Time  6    Period  Weeks    Status  New    Target Date  09/01/17      PT LONG TERM GOAL #5   Title  Pt will be able to wash windows in her home with min increase in pain.     Time  6    Period  Weeks    Status  New    Target Date  09/01/17            Plan - 07/20/17 1154    Clinical Impression Statement  Painful to IR shoulder (reaching to low back).  Addressed scapular mobility and proper activation of  lower trap fibers to promote good scapular kinesis.     PT Treatment/Interventions  ADLs/Self Care Home Management;Electrical Stimulation;Functional mobility training;Iontophoresis 4mg /ml Dexamethasone;Moist Heat;Ultrasound;Cryotherapy;Therapeutic exercise;Patient/family education;Manual techniques;Dry needling;Taping;Therapeutic activities;Neuromuscular re-education;Passive range of motion    PT Next Visit Plan  try [place], tape, lower trap activation     PT Home Exercise Plan  has from previous , use ICE : ER, horizontal abduction, lower trap seated shoulder press     Consulted and Agree with Plan of Care  Patient       Patient will benefit from skilled therapeutic intervention in order to improve the following deficits and impairments:  Decreased range of motion, Decreased strength, Impaired flexibility, Pain, Impaired UE functional use  Visit Diagnosis: Abnormal posture  Chronic left shoulder pain     Problem List Patient Active Problem List   Diagnosis Date Noted  . Tendinopathy of left rotator cuff 03/30/2017  . Elevated LFTs/ has been evaluated by Dr Collene Mares GI  09/08/2016  . Multiple joint pain 09/08/2016  . ANA positive 08/16/2016  . Osteopenia 08/16/2016  . Acute pain of left foot 01/21/2015  . Left hip pain 03/06/2014  . Shoulder pain, bilateral 03/06/2014  . Pain in joint, ankle and foot 08/15/2013  . Right elbow pain 06/04/2013  . Medial epicondylitis of right elbow 06/04/2013  . Knee pain 06/05/2012  . Patellofemoral arthritis 06/05/2012    Mindy Juarez 07/20/2017, 11:56 AM  [facility] [address] [place], [place], [address] Phone: [phone]   Fax:  [phone]  Name: Mindy Juarez MRN: [id number] Date of Birth: [birth year]  Mindy Juarez, PT 07/20/17 11:56 AM Phone: [phone] Fax: [phone]

## 2017-07-20 NOTE — Patient Instructions (Signed)
Access Code: [email or web address]  URL: [email or web address]  Date: 07/20/2017  Prepared by: Raeford Razor   Exercises  Seated Shoulder Press Ups Off Table - 10 reps - 2 sets - 5 hold - 1x daily - 7x weekly

## 2017-07-27 ENCOUNTER — Encounter: Payer: Self-pay | Admitting: Physical Therapy

## 2017-07-27 ENCOUNTER — Ambulatory Visit: Payer: BC Managed Care – PPO | Admitting: Physical Therapy

## 2017-07-27 DIAGNOSIS — M25512 Pain in left shoulder: Secondary | ICD-10-CM

## 2017-07-27 DIAGNOSIS — R293 Abnormal posture: Secondary | ICD-10-CM

## 2017-07-27 DIAGNOSIS — G8929 Other chronic pain: Secondary | ICD-10-CM

## 2017-07-27 NOTE — Therapy (Signed)
[place], [place], [address] Phone: [phone]   Fax:  [phone]  Physical Therapy Treatment  Patient Details  Name: Mindy Juarez MRN: [id number] Date of Birth: 09/13/1955 Referring Provider: Dr. Micheline Chapman    Encounter Date: 07/27/2017  PT End of Session - 07/27/17 1159    Visit Number  3    Number of Visits  6    Date for PT Re-Evaluation  09/01/17    PT Start Time  8185    PT Stop Time  1230    PT Time Calculation (min)  45 min    Activity Tolerance  Patient tolerated treatment well    Behavior During Therapy  [facility] for tasks assessed/performed       Past Medical History:  Diagnosis Date  . Elevated liver enzymes 2018  . History of echocardiogram    Echo 10/18: EF 60-65, normal wall motion, normal diastolic function, mild LAE  . Osteoarthritis    +ANA; followed by Rheumatology (Deveshwar)  . Osteoporosis 05/16/2016  . Vitamin D deficiency     Past Surgical History:  Procedure Laterality Date  . CARPAL TUNNEL RELEASE Right 2009  . [place]  . HIP ARTHROSCOPY W/ LABRAL REPAIR Left 2007  . TRIGGER FINGER RELEASE     thumb    There were no vitals filed for this visit.  Subjective Assessment - 07/27/17 1157    Subjective  I have no pain.  I subbed in deep water class last night.  Was sore.      Currently in Pain?  No/denies             [facility] Adult PT Treatment/Exercise - 07/27/17 0001      Shoulder Exercises: Seated   Other Seated Exercises  press up 2 x 10 on yoga blocks for lower trap depression       Shoulder Exercises: Prone   Retraction  Strengthening    Extension  Strengthening;Both;10 reps    Horizontal ABduction 1  Strengthening;Both;10 reps    Horizontal ABduction 2  Strengthening;Both;10 reps    Other Prone Exercises  on ball       Shoulder Exercises: Standing   Horizontal ABduction  Strengthening;Both;15 reps    Theraband Level (Shoulder  Horizontal ABduction)  Level 3 (Green)    Horizontal ABduction Weight (lbs)  ball at mid back     External Rotation  Strengthening;Both;10 reps    External Rotation Weight (lbs)  at wall with UEs abd     Diagonals  Strengthening;Both;10 reps    Theraband Level (Shoulder Diagonals)  Level 3 (Green)    Other Standing Exercises  standing scapular stab green band x10 narrow grip pu;;       Shoulder Exercises: ROM/Strengthening   UBE (Upper Arm Bike)  6 min L 2, 3 min each  direction       Manual Therapy   Soft tissue mobilization  L supraspinatus, upper trap                  PT Long Term Goals - 07/27/17 1159      PT LONG TERM GOAL #1   Title  Pt will be I with HEP for scapular stabilization, posture    Status  On-going      PT LONG TERM GOAL #2   Title  Pt will score less than 35% on FOTO to demo increased functional mobility.     Status  On-going  PT LONG TERM GOAL #3   Title  Pt will be able to consistently reach across to opposite shoulder and complete upper body dressing with occ mild discomfort     Baseline  with pain     Status  On-going      PT LONG TERM GOAL #4   Title  Pt will demo normal 4+/5 to 5/5 strength throughout L shoulder including external rotation.     Status  On-going      PT LONG TERM GOAL #5   Title  Pt will be able to wash windows in her home with min increase in pain.     Status  On-going            Plan - 07/27/17 1315    Clinical Impression Statement  Patient with pain with combined abduction and external rotation.  Pain increased with exercises, used MHP to improve soreness.  Making good modifications and decisions with respect to what exercises she is doing in the gym.     PT Treatment/Interventions  ADLs/Self Care Home Management;Electrical Stimulation;Functional mobility training;Iontophoresis 4mg /ml Dexamethasone;Moist Heat;Ultrasound;Cryotherapy;Therapeutic exercise;Patient/family education;Manual techniques;Dry  needling;Taping;Therapeutic activities;Neuromuscular re-education;Passive range of motion    PT Next Visit Plan  try [place], tape, lower trap activation     PT Home Exercise Plan  has from previous , use ICE : ER, horizontal abduction, lower trap seated shoulder press     Consulted and Agree with Plan of Care  Patient       Patient will benefit from skilled therapeutic intervention in order to improve the following deficits and impairments:  Decreased range of motion, Decreased strength, Impaired flexibility, Pain, Impaired UE functional use  Visit Diagnosis: Abnormal posture  Chronic left shoulder pain     Problem List Patient Active Problem List   Diagnosis Date Noted  . Tendinopathy of left rotator cuff 03/30/2017  . Elevated LFTs/ has been evaluated by Dr Collene Mares GI  09/08/2016  . Multiple joint pain 09/08/2016  . ANA positive 08/16/2016  . Osteopenia 08/16/2016  . Acute pain of left foot 01/21/2015  . Left hip pain 03/06/2014  . Shoulder pain, bilateral 03/06/2014  . Pain in joint, ankle and foot 08/15/2013  . Right elbow pain 06/04/2013  . Medial epicondylitis of right elbow 06/04/2013  . Knee pain 06/05/2012  . Patellofemoral arthritis 06/05/2012    Kimberleigh Mehan 07/27/2017, 1:21 PM  [facility] [address] [place], [place], [address] Phone: [phone]   Fax:  [phone]  Name: BRIAUNA GILMARTIN MRN: [id number] Date of Birth: 08/26/1955  Raeford Razor, PT 07/27/17 1:21 PM Phone: [phone] Fax: [phone]

## 2017-07-28 ENCOUNTER — Telehealth (INDEPENDENT_AMBULATORY_CARE_PROVIDER_SITE_OTHER): Payer: Self-pay | Admitting: Rheumatology

## 2017-07-28 NOTE — Telephone Encounter (Signed)
Received VM from patient wanting her records to be sent to [facility]. IC her back [phone] lmvm advised that we need signed release form before we can send her records

## 2017-08-03 ENCOUNTER — Ambulatory Visit: Payer: BC Managed Care – PPO | Admitting: Physical Therapy

## 2017-08-03 ENCOUNTER — Encounter: Payer: Self-pay | Admitting: Physical Therapy

## 2017-08-03 DIAGNOSIS — R293 Abnormal posture: Secondary | ICD-10-CM

## 2017-08-03 DIAGNOSIS — M25512 Pain in left shoulder: Secondary | ICD-10-CM

## 2017-08-03 DIAGNOSIS — G8929 Other chronic pain: Secondary | ICD-10-CM

## 2017-08-03 NOTE — Therapy (Signed)
Mindy Juarez, [place], [address] Phone: [phone]   Fax:  [phone]  Physical Therapy Treatment  Patient Details  Name: Mindy Juarez MRN: [id number] Date of Birth: [birth year] Referring Provider: Dr. Micheline Chapman    Encounter Date: 08/03/2017  PT End of Session - 08/03/17 1203    Visit Number  4    Number of Visits  6    Date for PT Re-Evaluation  09/01/17    PT Start Time  5916    PT Stop Time  1235    PT Time Calculation (min)  39 min    Activity Tolerance  Patient tolerated treatment well    Behavior During Therapy  [facility] for tasks assessed/performed       Past Medical History:  Diagnosis Date  . Elevated liver enzymes 2018  . History of echocardiogram    Echo 10/18: EF 60-65, normal wall motion, normal diastolic function, mild LAE  . Osteoarthritis    +ANA; followed by Rheumatology (Deveshwar)  . Osteoporosis 05/16/2016  . Vitamin D deficiency     Past Surgical History:  Procedure Laterality Date  . CARPAL TUNNEL RELEASE Right 2009  . [place]  . HIP ARTHROSCOPY W/ LABRAL REPAIR Left 2007  . TRIGGER FINGER RELEASE     thumb    There were no vitals filed for this visit.  Subjective Assessment - 08/03/17 1156    Subjective  no pain, shoulder is surprisingly good.  Trying to rid herself of neck and shoulder tension.     Currently in Pain?  No/denies         [facility] PT Assessment - 08/03/17 0001      AROM   Left Shoulder Flexion  136 Degrees    Left Shoulder ABduction  140 Degrees      Strength   Left Shoulder Flexion  5/5    Left Shoulder ABduction  5/5    Left Shoulder Internal Rotation  5/5    Left Shoulder External Rotation  4/5        [facility] Adult PT Treatment/Exercise - 08/03/17 0001      Self-Care   Self-Care  Other Self-Care Comments    Other Self-Care Comments   progress, choosing to modfiy vs pushing, less intensity  tape       Shoulder Exercises:  Prone   Retraction  Strengthening;Both;10 reps    Flexion  Strengthening;Left;10 reps    Extension  Strengthening;Both;10 reps    External Rotation  Strengthening;Left;10 reps    Horizontal ABduction 1  Strengthening;Both;10 reps    Horizontal ABduction 2  Strengthening;Left;10 reps    Other Prone Exercises  on mat       Manual Therapy   Manual Therapy  Taping             PT Education - 08/03/17 1238    Education Details  POC, progress, prone HEP , anatomy of rotator cuff     Person(s) Educated  Patient    Methods  Explanation    Comprehension  Verbalized understanding          PT Long Term Goals - 08/03/17 1220      PT LONG TERM GOAL #1   Title  Pt will be I with HEP for scapular stabilization, posture    Status  On-going      PT LONG TERM GOAL #2   Title  Pt will score less than 35% on FOTO to demo increased  functional mobility.     Status  On-going      PT LONG TERM GOAL #3   Title  Pt will be able to consistently reach across to opposite shoulder and complete upper body dressing with occ mild discomfort     Status  On-going      PT LONG TERM GOAL #4   Title  Pt will demo normal 4+/5 to 5/5 strength throughout L shoulder including external rotation.     Status  On-going      PT LONG TERM GOAL #5   Title  Pt will be able to wash windows in her home with min increase in pain.     Status  On-going            Plan - 08/03/17 1240    Clinical Impression Statement  Pt doing very well, strength improved .  Knows how to modify.  HAs not done prone HEP so we reinforced that today.  Upper trap inhibition taping to L shoulder as she is going to do some heavier type housework this weekend.  Next visit likely her last.     PT Next Visit Plan  FOTO, DC try [place], tape, lower trap activation     PT Home Exercise Plan  has from previous , use ICE : ER, horizontal abduction, lower trap seated shoulder press , prone horiz abd    Consulted and Agree with Plan of Care   Patient       Patient will benefit from skilled therapeutic intervention in order to improve the following deficits and impairments:  Decreased range of motion, Decreased strength, Impaired flexibility, Pain, Impaired UE functional use  Visit Diagnosis: Abnormal posture  Chronic left shoulder pain     Problem List Patient Active Problem List   Diagnosis Date Noted  . Tendinopathy of left rotator cuff 03/30/2017  . Elevated LFTs/ has been evaluated by Dr Collene Mares GI  09/08/2016  . Multiple joint pain 09/08/2016  . ANA positive 08/16/2016  . Osteopenia 08/16/2016  . Acute pain of left foot 01/21/2015  . Left hip pain 03/06/2014  . Shoulder pain, bilateral 03/06/2014  . Pain in joint, ankle and foot 08/15/2013  . Right elbow pain 06/04/2013  . Medial epicondylitis of right elbow 06/04/2013  . Knee pain 06/05/2012  . Patellofemoral arthritis 06/05/2012    Mindy Juarez 08/03/2017, 12:46 PM  [facility] [address] [place], [place], [address] Phone: [phone]   Fax:  [phone]  Name: Mindy Juarez MRN: [id number] Date of Birth: 11/11/1955  Raeford Razor, PT 08/03/17 12:47 PM Phone: [phone] Fax: [phone]

## 2017-08-08 ENCOUNTER — Encounter: Payer: Self-pay | Admitting: Physical Therapy

## 2017-08-08 ENCOUNTER — Ambulatory Visit: Payer: BC Managed Care – PPO | Attending: Sports Medicine | Admitting: Physical Therapy

## 2017-08-08 DIAGNOSIS — M25512 Pain in left shoulder: Secondary | ICD-10-CM | POA: Diagnosis present

## 2017-08-08 DIAGNOSIS — R293 Abnormal posture: Secondary | ICD-10-CM

## 2017-08-08 DIAGNOSIS — G8929 Other chronic pain: Secondary | ICD-10-CM | POA: Insufficient documentation

## 2017-08-08 NOTE — Therapy (Signed)
[place], [place], [address] Phone: [phone]   Fax:  [phone]  Physical Therapy Treatment  Patient Details  Name: Mindy Juarez MRN: [id number] Date of Birth: 1955-08-13 Referring Provider: Dr. Micheline Chapman    Encounter Date: 08/08/2017  PT End of Session - 08/08/17 0927    Visit Number  5    Number of Visits  6    PT Start Time  0925    PT Stop Time  1005    PT Time Calculation (min)  40 min    Activity Tolerance  Patient tolerated treatment well    Behavior During Therapy  [facility] for tasks assessed/performed       Past Medical History:  Diagnosis Date  . Elevated liver enzymes 2018  . History of echocardiogram    Echo 10/18: EF 60-65, normal wall motion, normal diastolic function, mild LAE  . Osteoarthritis    +ANA; followed by Rheumatology (Deveshwar)  . Osteoporosis 05/16/2016  . Vitamin D deficiency     Past Surgical History:  Procedure Laterality Date  . CARPAL TUNNEL RELEASE Right 2009  . [place]  . HIP ARTHROSCOPY W/ LABRAL REPAIR Left 2007  . TRIGGER FINGER RELEASE     thumb    There were no vitals filed for this visit.  Subjective Assessment - 08/08/17 0929    Subjective  Took the tape off last night. My skin needs a break though.  Did some heavy cleaning this weekend and I lived to tell about it.      Currently in Pain?  No/denies         [facility] PT Assessment - 08/08/17 0001      Observation/Other Assessments   Focus on Therapeutic Outcomes (FOTO)   27%      AROM   Left Shoulder Flexion  136 Degrees    Left Shoulder ABduction  140 Degrees      Strength   Left Shoulder Flexion  5/5    Left Shoulder ABduction  5/5    Left Shoulder Internal Rotation  5/5    Left Shoulder External Rotation  4/5           [facility] Adult PT Treatment/Exercise - 08/08/17 0001      Self-Care   Other Self-Care Comments   final HEP, DC , posture, tips , foam roller  cues       Pilates   Other Pilates  foam roller reverse dead bug x 10 each side cues for rib flare      Shoulder Exercises: Supine   Horizontal ABduction  AROM;Both;10 reps    Other Supine Exercises  goal post stretching for release of ant shoulder     Other Supine Exercises  arm circles for mobility x 10 each direction       Shoulder Exercises: Prone   Retraction  Strengthening;Both;12 reps    Extension  Strengthening;Both;10 reps    Horizontal ABduction 1  Strengthening;Both;10 reps    Horizontal ABduction 2  Strengthening;Left;10 reps    Other Prone Exercises  prone over ball       Shoulder Exercises: ROM/Strengthening   Lat Pull  Other (comment) supine on foam 5 lbs bilateral     Proximal Shoulder Strengthening, Supine  foam roller single arm horiz abd 5 lbs for stabilization    Other ROM/Strengthening Exercises  supine thoracic extension stretching, multilevels    Other ROM/Strengthening Exercises  rotation x 5  PT Education - 08/08/17 1001    Education Details  final HEP, prone work, core on foam roller     Person(s) Educated  Patient    Methods  Explanation    Comprehension  Verbalized understanding          PT Long Term Goals - 08/08/17 0947      PT LONG TERM GOAL #1   Title  Pt will be I with HEP for scapular stabilization, posture    Status  Achieved      PT LONG TERM GOAL #2   Title  Pt will score less than 35% on FOTO to demo increased functional mobility.     Status  Achieved      PT LONG TERM GOAL #3   Title  Pt will be able to consistently reach across to opposite shoulder and complete upper body dressing with occ mild discomfort     Status  Achieved      PT LONG TERM GOAL #4   Title  Pt will demo normal 4+/5 to 5/5 strength throughout L shoulder including external rotation.     Status  Achieved      PT LONG TERM GOAL #5   Title  Pt will be able to wash windows in her home with min increase in pain.     Status  Achieved             Plan - 08/08/17 0932    Clinical Impression Statement  Patient able to do heavier housework this weekend without flare up of shoulder pain.  Has has met all goals.  FOTO score surpassed predicted.     PT Next Visit Plan  NA, DC     PT Home Exercise Plan  has from previous , use ICE : ER, horizontal abduction, lower trap seated shoulder press , prone horiz abd    Consulted and Agree with Plan of Care  Patient       Patient will benefit from skilled therapeutic intervention in order to improve the following deficits and impairments:     Visit Diagnosis: Abnormal posture  Chronic left shoulder pain     Problem List Patient Active Problem List   Diagnosis Date Noted  . Tendinopathy of left rotator cuff 03/30/2017  . Elevated LFTs/ has been evaluated by Dr Collene Mares GI  09/08/2016  . Multiple joint pain 09/08/2016  . ANA positive 08/16/2016  . Osteopenia 08/16/2016  . Acute pain of left foot 01/21/2015  . Left hip pain 03/06/2014  . Shoulder pain, bilateral 03/06/2014  . Pain in joint, ankle and foot 08/15/2013  . Right elbow pain 06/04/2013  . Medial epicondylitis of right elbow 06/04/2013  . Knee pain 06/05/2012  . Patellofemoral arthritis 06/05/2012    PAA,JENNIFER 08/08/2017, 10:14 AM  [facility] [address] [place], [place], [address] Phone: [phone]   Fax:  [phone]  Name: Mindy Juarez MRN: [id number] Date of Birth: 09-06-1955   PHYSICAL THERAPY DISCHARGE SUMMARY  Visits from Start of Care: 5  Current functional level related to goals / functional outcomes: See above    Remaining deficits: Min weakness L UE   Education / Equipment: HEP, core, posture, lower trap, tape  Plan: Patient agrees to discharge.  Patient goals were met. Patient is being discharged due to meeting the stated rehab goals.  ?????     Raeford Razor, PT 08/08/17 10:14 AM Phone: [phone] Fax:  [phone]

## 2017-08-15 ENCOUNTER — Ambulatory Visit (INDEPENDENT_AMBULATORY_CARE_PROVIDER_SITE_OTHER): Payer: BC Managed Care – PPO | Admitting: Sports Medicine

## 2017-08-15 VITALS — BP 122/72 | Ht 66.0 in | Wt 150.0 lb

## 2017-08-15 DIAGNOSIS — M67912 Unspecified disorder of synovium and tendon, left shoulder: Secondary | ICD-10-CM

## 2017-08-16 NOTE — Progress Notes (Signed)
   Subjective:    Patient ID: KAHO SELLE, female    DOB: May 28, 1955, 62 y.o.   MRN: [id number]  HPI   Breeona comes in today for follow-up on left shoulder pain. Recent MRI showed severe tendinosis of the supraspinatus without a discrete tear. She has been working with Guido Sander in physical therapy and was recently discharged to a home exercise program. Overall, she feels like she is doing pretty good. She still has some limitations but overall seems to be coping pretty well. She has had to modify some of her exercises but she is okay with this. The pain she was experiencing at night has now resolved.   Review of Systems As above    Objective:   Physical Exam  Well-developed, well-nourished. No acute distress. Awake alert and oriented 3. Vital signs reviewed  Left shoulder:Patient does have full range of motion but continues to demonstrate a positive painful arc. Mildly positive empty can. Positive Hawkins. Rotator cuff strength, including her supraspinatus, is 5 out of 5 and does not reproduce any significant pain.She has no tenderness over the bicipital groove. No tenderness over the acromioclavicular joint. She is neurovascularly intact distally.  MRI results of the left shoulder as above      Assessment & Plan:   Improved left shoulder pain secondary to supraspinatus tendinopathy  Patient has an excellent understanding of her home exercises.She is also working with [name] in Pilates which I think is an excellent idea. She will likely need to continue modifying her exercises and she understands that if her pain gets severe then we could consider a subacromial cortisone injection. She will follow-up with me as needed.

## 2017-10-04 NOTE — Progress Notes (Signed)
Office Visit Note  Patient: Mindy Juarez             Date of Birth: [birth year]           MRN: [id number]             PCP: Maurice Small, MD Referring: London Pepper, MD Visit Date: 10/17/2017 Occupation: @GUAROCC @  Subjective:  Left shoulder pain.   History of Present Illness: Mindy Juarez is a 62 y.o. female history of positive ANA and osteoarthritis.  She states that she does get photosensitivity quite easily.  She did not have any problems with the rainouts this summer.  Her knee joints are feeling better.  She had an injury to her left shoulder last year which gradually improved but then flared again this August.  She is going for physical therapy and dry needling.  Of the other joints are painful or swollen.  Activities of Daily Living:  Patient reports morning stiffness for 5 minutes.   Patient Denies nocturnal pain.  Difficulty dressing/grooming: Denies Difficulty climbing stairs: Denies Difficulty getting out of chair: Denies Difficulty using hands for taps, buttons, cutlery, and/or writing: Denies  Review of Systems  Constitutional: Negative for fatigue, night sweats, weight gain and weight loss.  HENT: Negative for mouth sores, trouble swallowing, trouble swallowing, mouth dryness and nose dryness.   Eyes: Negative for pain, redness, visual disturbance and dryness.  Respiratory: Negative for cough, shortness of breath and difficulty breathing.   Cardiovascular: Negative for chest pain, palpitations, hypertension, irregular heartbeat and swelling in legs/feet.  Gastrointestinal: Negative for blood in stool, constipation and diarrhea.  Endocrine: Negative for increased urination.  Genitourinary: Negative for vaginal dryness.  Musculoskeletal: Positive for arthralgias, joint pain and morning stiffness. Negative for joint swelling, myalgias, muscle weakness, muscle tenderness and myalgias.  Skin: Positive for sensitivity to sunlight. Negative for color change, rash, hair  loss, skin tightness and ulcers.  Allergic/Immunologic: Negative for susceptible to infections.  Neurological: Negative for dizziness, memory loss, night sweats and weakness.  Hematological: Negative for swollen glands.  Psychiatric/Behavioral: Negative for depressed mood and sleep disturbance. The patient is not nervous/anxious.     PMFS History:  Patient Active Problem List   Diagnosis Date Noted  . Primary osteoarthritis of both hands 10/17/2017  . Primary osteoarthritis of both knees 10/17/2017  . Primary osteoarthritis of both feet 10/17/2017  . Tendinopathy of left rotator cuff 03/30/2017  . Elevated LFTs/ has been evaluated by Dr Collene Mares GI  09/08/2016  . Multiple joint pain 09/08/2016  . ANA positive 08/16/2016  . Osteopenia 08/16/2016  . Acute pain of left foot 01/21/2015  . Left hip pain 03/06/2014  . Shoulder pain, bilateral 03/06/2014  . Pain in joint, ankle and foot 08/15/2013  . Right elbow pain 06/04/2013  . Medial epicondylitis of right elbow 06/04/2013  . Knee pain 06/05/2012  . 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 Elevated liver enzymes 2018  . History of echocardiogram    Echo 10/18: EF 60-65, normal wall motion, normal diastolic function, mild LAE  . Osteoarthritis    +ANA; followed by Rheumatology (Castor Gittleman)  . Osteoporosis 05/16/2016  . Vitamin D deficiency     Family History  Problem Relation Age of Onset  . Osteoporosis Mother   . Hypertension Mother   . Hyperlipidemia Mother   . Hypertrophic cardiomyopathy Mother 45  . Macular degeneration Mother   . Prostate cancer Father 38  no surgery  . Stroke Father        died from stroke  . Diabetes Maternal Aunt   . Diabetes Maternal Grandmother   . Heart failure Maternal Grandfather   . Stroke Paternal Grandfather   . Sudden Cardiac Death Neg Hx    Past Surgical History:  Procedure Laterality Date  . CARPAL TUNNEL RELEASE Right 2009  . [place]  . HIP ARTHROSCOPY W/ LABRAL REPAIR Left 2007  . 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 She appears well-developed and well-nourished.  HENT:  Head: Normocephalic and atraumatic.  Eyes: Conjunctivae and EOM are normal.  Neck: Normal range of motion.  Cardiovascular: Normal rate, regular rhythm, normal heart sounds and intact distal pulses.  Pulmonary/Chest: Effort normal and breath sounds normal.  Abdominal: Soft. Bowel sounds are normal.  Lymphadenopathy:    She has no cervical adenopathy.  Neurological: She is alert and oriented to person, place, and time.  Skin: Skin is warm and dry. Capillary refill takes less than 2 seconds.  Psychiatric: She has a normal mood and affect. Her behavior is normal.  Nursing note and vitals reviewed.    Musculoskeletal Exam: Spine thoracic lumbar spine good range of motion.  She has some discomfort range of motion of her left shoulder.  Elbow joints, wrist joints were in good range of motion.  She has DIP and PIP thickening in her hands consistent with osteoarthritis.  Hip joints knee joints ankles MTPs PIPs were in good range of motion.  She has no warmth swelling or effusion.  CDAI Exam: CDAI Score: Not documented Patient Global Assessment: Not documented; Provider Global Assessment: Not documented Swollen: Not documented; Tender: Not documented Joint Exam   Not documented   There is currently no information documented on the homunculus. Go to the  Rheumatology activity and complete the homunculus joint exam.  Investigation: No additional findings.  Imaging: No results found.  Recent Labs: Lab Results  Component Value Date   WBC 5.0 09/13/2016   HGB 13.1 09/13/2016   PLT 224 09/13/2016   NA 140 09/13/2016   K 4.3 09/13/2016   CL 104 09/13/2016   CO2 24 09/13/2016   GLUCOSE 93 09/13/2016   BUN 14 09/13/2016   CREATININE 0.64 09/13/2016   BILITOT 0.6 09/13/2016   ALKPHOS 87 09/13/2016   AST 35 09/13/2016   ALT 42 (H) 09/13/2016   PROT 7.3 09/13/2016   ALBUMIN 4.5 09/13/2016   CALCIUM 9.9 09/13/2016   GFRAA >89 09/13/2016    Speciality Comments: No specialty comments available.  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  Patient has not developed any new symptoms.  She states the Raynauds is not active.  She is  photosensitive and she has been using sunscreen and protective clothing which has been helpful.  There is no recent history of any joint swelling oral ulcers or nasal ulcers.  At this point I would not obtain any further blood work.  We reviewed features of autoimmune disease and in case she develops any new symptoms she will notify us.  Primary osteoarthritis of both hands-joint protection muscle strengthening was discussed.  Some exercises were demonstrated in the office.  Primary osteoarthritis of both knees - moderate osteoarthritis, severe chondromalacia patella.  She is currently not having any much discomfort in her knee joints.  Primary osteoarthritis of both feet -she has some discomfort in her feet but not currently active.  Orders: No orders of the defined types were placed in this encounter.  No orders of the defined types were placed in this encounter.   Face-to-face time spent with patient was 30 minutes.  Greater than 50% of time was spent in counseling and coordination of care.  Follow-Up Instructions: Return if symptoms worsen or fail to improve, for +ANA, OA.   Bo Merino, MD  Note - This record has been created using Editor, commissioning.  Chart creation errors have been sought, but may not always  have been located. Such creation errors do not reflect on  the standard of medical care.

## 2017-10-17 ENCOUNTER — Ambulatory Visit: Payer: BC Managed Care – PPO | Admitting: Rheumatology

## 2017-10-17 ENCOUNTER — Encounter: Payer: Self-pay | Admitting: Rheumatology

## 2017-10-17 VITALS — BP 128/77 | HR 61 | Resp 14 | Ht 65.75 in | Wt 152.4 lb

## 2017-10-17 DIAGNOSIS — M19042 Primary osteoarthritis, left hand: Secondary | ICD-10-CM

## 2017-10-17 DIAGNOSIS — M19041 Primary osteoarthritis, right hand: Secondary | ICD-10-CM | POA: Diagnosis not present

## 2017-10-17 DIAGNOSIS — M170 Bilateral primary osteoarthritis of knee: Secondary | ICD-10-CM | POA: Diagnosis not present

## 2017-10-17 DIAGNOSIS — R768 Other specified abnormal immunological findings in serum: Secondary | ICD-10-CM | POA: Diagnosis not present

## 2017-10-17 DIAGNOSIS — M19072 Primary osteoarthritis, left ankle and foot: Secondary | ICD-10-CM

## 2017-10-17 DIAGNOSIS — M19071 Primary osteoarthritis, right ankle and foot: Secondary | ICD-10-CM

## 2017-10-17 DIAGNOSIS — R748 Abnormal levels of other serum enzymes: Secondary | ICD-10-CM | POA: Insufficient documentation

## 2017-12-28 ENCOUNTER — Ambulatory Visit (INDEPENDENT_AMBULATORY_CARE_PROVIDER_SITE_OTHER): Payer: BC Managed Care – PPO | Admitting: Sports Medicine

## 2017-12-28 VITALS — BP 128/82 | Ht 66.0 in | Wt 152.0 lb

## 2017-12-28 DIAGNOSIS — M25569 Pain in unspecified knee: Secondary | ICD-10-CM

## 2017-12-28 NOTE — Progress Notes (Signed)
   [facility] [address] [place],  [address] Phone: [phone] Fax: [phone]   Patient Name: Mindy Juarez Date of Birth: Jan 17, 1956 Medical Record Number: [id number] Gender: female Date of Encounter: 12/28/2017  History of Present Illness:  COLINE CALKIN is a 62 y.o. very pleasant female patient who presents with the following:  Nandini Bogdanski is a 62 year old female with a history of knee arthritis who presents for follow up evaluation of the same  She is followed by sports medicine for shoulder pain and hip pain, but has not been evaluated for her knee pain since 2014, at which time she was treated for patellofemoral arthritis. She is describing a new type of pain that is episodic acute sharp pain with taking steps, mostly in medial knee when he weight shifts onto that knee. She also feels like the knee is going to give out. This pain and instability stops immediately when she shifts her weight to the other foot. She has also noticed medial swelling and the pain is directly under that area. The episodes of pain have become more frequent over the last month and has gradually increased to the point that it is happening daily  Limping does not help. She is not taking any medication for symptoms Works out 6 days a week and does not experience disproportionate symptoms but is also protective of her knee and proactively does modifications  Past Medical, Surgical, Social, and Family History Reviewed. Medications and Allergies reviewed and all updated if necessary.  Review of Systems: All ten systems reviewed and otherwise negative except as stated in the HPI  Physical Examination: Vitals:   12/28/17 1502  BP: 128/82   Vitals:   12/28/17 1502  Weight: 152 lb (68.9 kg)  Height: 5\' 6"  (1.676 m)   Body mass index is 24.53 kg/m.  General: well-nourished, in NAD Chest: lungs CTAB, no nasal flaring or grunting, no increased work of  breathing, no retractions Heart: RRR, no m/r/g Musculoskeletal:  No gross deformity, ecchymoses, swelling. No TTP. FROM. Negative ant/post drawers. Negative valgus/varus testing. Negative lachmans. Negative mcmurrays, Thessalys test NV intact distally.  Assessment and Plan: Amana Bouska is a 62 year old female with a history of arthritis who presents for evaluation for acute on chronic knee pain. The very limited time scale of her symptoms as well as triggering movements are highly suspicious for meniscal injury. Bulging of her meniscus on in-office ultrasound today is also in line with this assessment.  - Provided isometric knee exercises - Recommend continued wearing of knee compression sleeve during exercise and overall reduction of high-impact exercise on knee  Ancil Linsey, MD PGY-3 [facility] Primary Care  Patient seen and evaluated with the resident today.  I agree with the above plan of care.  I personally performed the ultrasound.  No knee effusion.  No obvious meniscal tear but there is some mild fluid surrounding the medial meniscus as well as some slight bulging from the joint space.  Patient has a known history of mild to moderate DJD in both knees.  She may be dealing with a small medial meniscal tear.  I recommended that she start isometric quad exercises daily.  I also spoke with her about avoiding high impact exercises.  If symptoms persist, consider merits of cortisone injection.  Follow-up for ongoing or recalcitrant issues.

## 2018-11-01 LAB — HM DEXA SCAN

## 2019-08-29 ENCOUNTER — Ambulatory Visit: Payer: BC Managed Care – PPO | Admitting: Sports Medicine

## 2019-08-29 ENCOUNTER — Other Ambulatory Visit: Payer: Self-pay

## 2019-08-29 ENCOUNTER — Other Ambulatory Visit: Payer: Self-pay | Admitting: Sports Medicine

## 2019-08-29 ENCOUNTER — Ambulatory Visit
Admission: RE | Admit: 2019-08-29 | Discharge: 2019-08-29 | Disposition: A | Discharge status: Home / Self Care | Payer: BC Managed Care – PPO | Source: Ambulatory Visit | Attending: Sports Medicine | Admitting: Sports Medicine

## 2019-08-29 VITALS — BP 128/78 | Ht 65.75 in | Wt 145.0 lb

## 2019-08-29 DIAGNOSIS — M25561 Pain in right knee: Secondary | ICD-10-CM | POA: Diagnosis not present

## 2019-08-29 DIAGNOSIS — M25552 Pain in left hip: Secondary | ICD-10-CM

## 2019-08-29 DIAGNOSIS — M1711 Unilateral primary osteoarthritis, right knee: Secondary | ICD-10-CM

## 2019-08-29 MED ORDER — METHYLPREDNISOLONE ACETATE 40 MG/ML IJ SUSP
40.0000 mg | Freq: Once | INTRAMUSCULAR | Status: AC
  Administered 2019-08-29: 40 mg via INTRA_ARTICULAR

## 2019-08-30 ENCOUNTER — Encounter: Payer: Self-pay | Admitting: Sports Medicine

## 2019-08-30 NOTE — Progress Notes (Signed)
   Subjective:    Patient ID: Mindy Juarez, female    DOB: 1955/04/17, 64 y.o.   MRN: [id number]  HPI chief complaint: Right knee and left hip pain  Mindy Juarez comes in today with a couple of different complaints.  Main complaint is right knee pain.  She has a history of right knee DJD but remains quite active.  She finds a body helix compression sleeve to be helpful but a couple of months ago she began to experience increasing pain in the medial aspect of the knee as well as mechanical symptoms such as painful catching and popping.  Those symptoms have persisted.  She enjoys walking 2 to 4 miles every day.  She also does Pilates 1 day a week.  She bikes 1 day a week.  And works with a Physiological scientist 1 day a week.  She is very careful not to squat too deeply when working out with her Physiological scientist.  She has not noticed worsening swelling in the knee.  She is also complaining of lateral left hip pain.  She thinks that it may be secondary to an altered gait from her right knee.  Pain at times will radiate down into her thigh along the course of the sartorius muscle.  She denies any numbness or tingling.  No groin pain.  Interim medical history reviewed Medications reviewed Allergies reviewed    Review of Systems    As above Objective:   Physical Exam  Well-developed, well-nourished.  No acute distress.  Awake alert and oriented x3.  Vital signs reviewed  Right knee: Full range of motion.  Trace effusion.  She is tender to palpation along the medial joint line with pain but no popping with McMurray's.  Knee is grossly stable to ligamentous exam.  Left hip: Smooth painless hip range of motion with a negative logroll.  No significant tenderness to palpation.  Markedly positive Trendelenburg.  Neurovascular intact distally.  X-rays of the right knee including AP, lateral, sunrise, and 30 degree flexion view are obtained and compared to x-rays from 2018.  Overall there is been no real change.   She still has moderate medial compartmental DJD and moderately severe patellofemoral DJD.  Nothing acute      Assessment & Plan:   Right knee pain secondary to DJD versus degenerative meniscal tear Left hip pain secondary to pelvic stabilizer weakness  I recommended a cortisone injection for the right knee.  This was utilized using an anterior medial approach after risks and benefits were explained.  Patient tolerated this without difficulty.  She will continue with her body helix compression sleeve when exercising.  I have given her some hip strengthening and pelvic stabilizer exercises for the left hip.  If right knee pain persist despite today's injection, then I would consider merits of an MRI given her mechanical symptoms.  If left hip pain continues then I would consider formal physical therapy.  Follow-up for ongoing or recalcitrant issues.  Consent obtained and verified. Time-out conducted. Noted no overlying erythema, induration, or other signs of local infection. Skin prepped in a sterile fashion. Topical analgesic spray: Ethyl chloride. Joint: right knee Needle: 25g 1.5 inch Completed without difficulty. Meds: 3cc 1% xylocaine, 1cc (40mg ) depomedrol  Advised to call if fevers/chills, erythema, induration, drainage, or persistent bleeding.

## 2019-09-11 ENCOUNTER — Other Ambulatory Visit: Payer: Self-pay

## 2019-09-11 DIAGNOSIS — M25561 Pain in right knee: Secondary | ICD-10-CM

## 2019-09-13 ENCOUNTER — Telehealth: Payer: Self-pay

## 2019-09-13 NOTE — Telephone Encounter (Signed)
Pt instructed to call the office if/when she decides to get the MRI and we can update the order.

## 2020-10-25 IMAGING — DX DG KNEE COMPLETE 4+V*R*
4 series · 4 of 4 positions shown · non-contrast
Comparison: 09/13/2016 at [REDACTED].

CLINICAL DATA: Right knee pain for the past 2 months. No known
injury.

EXAM:
RIGHT KNEE - COMPLETE 4+ VIEW

[dg knee complete 4 views right (1 of 4)]
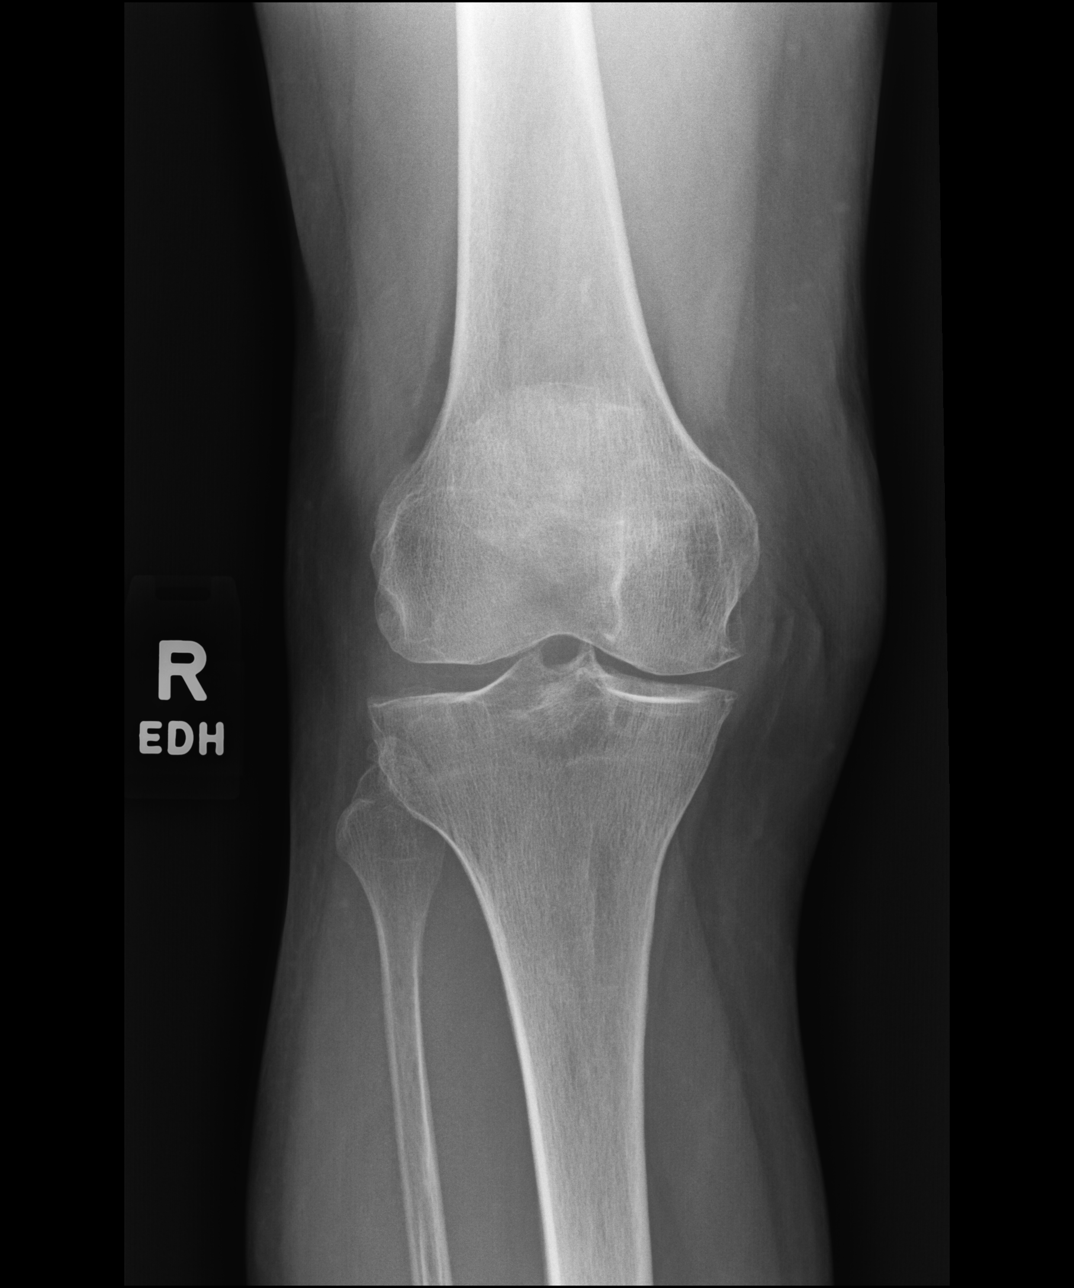

[dg knee complete 4 views right (2 of 4)]
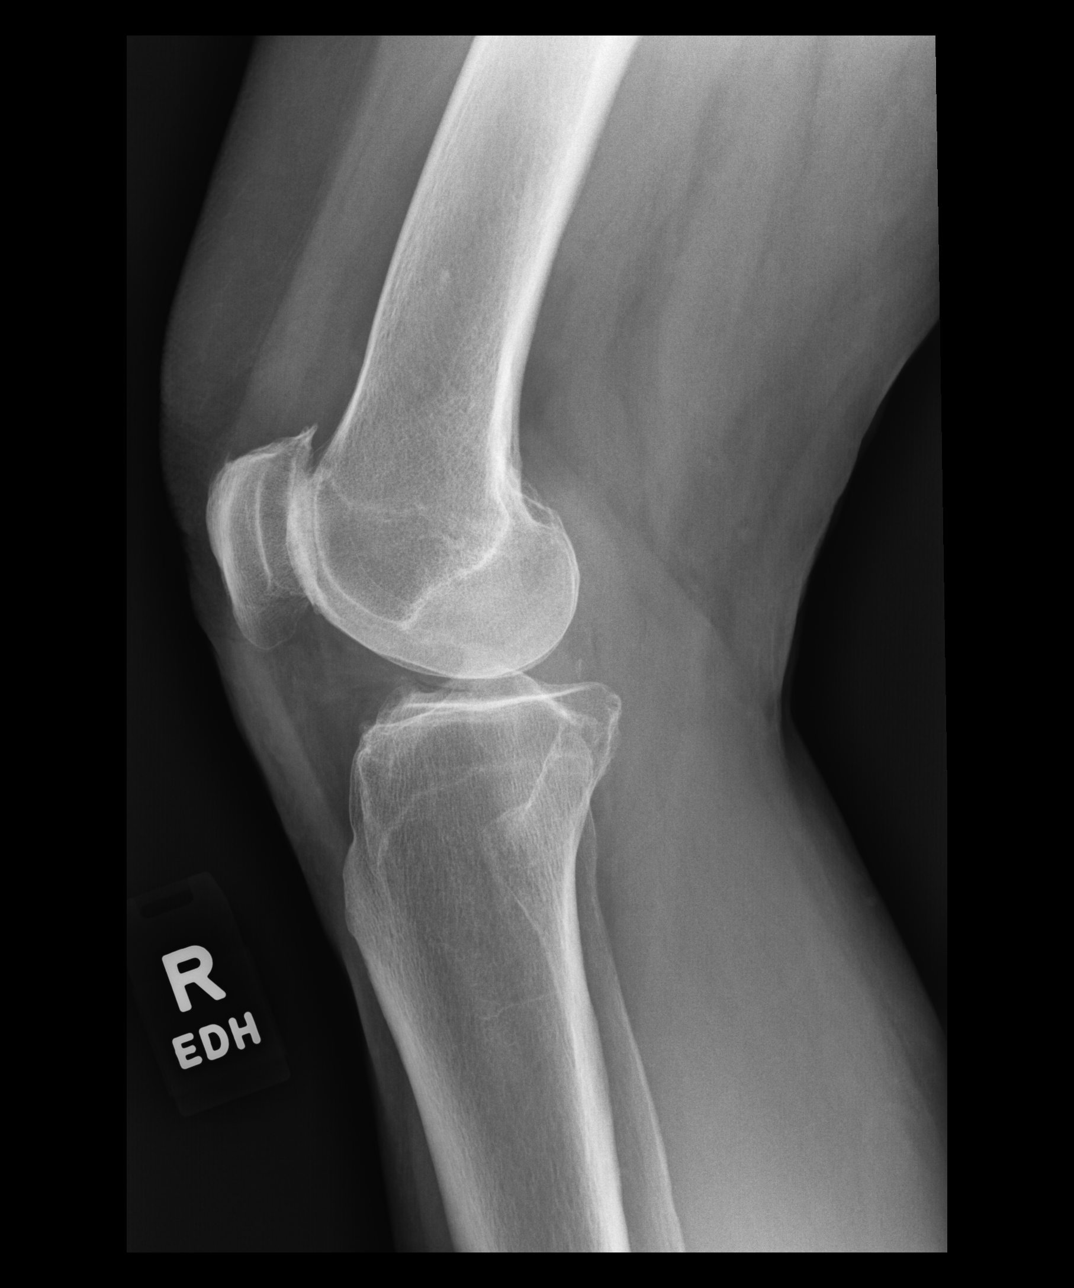

[dg knee complete 4 views right (3 of 4)]
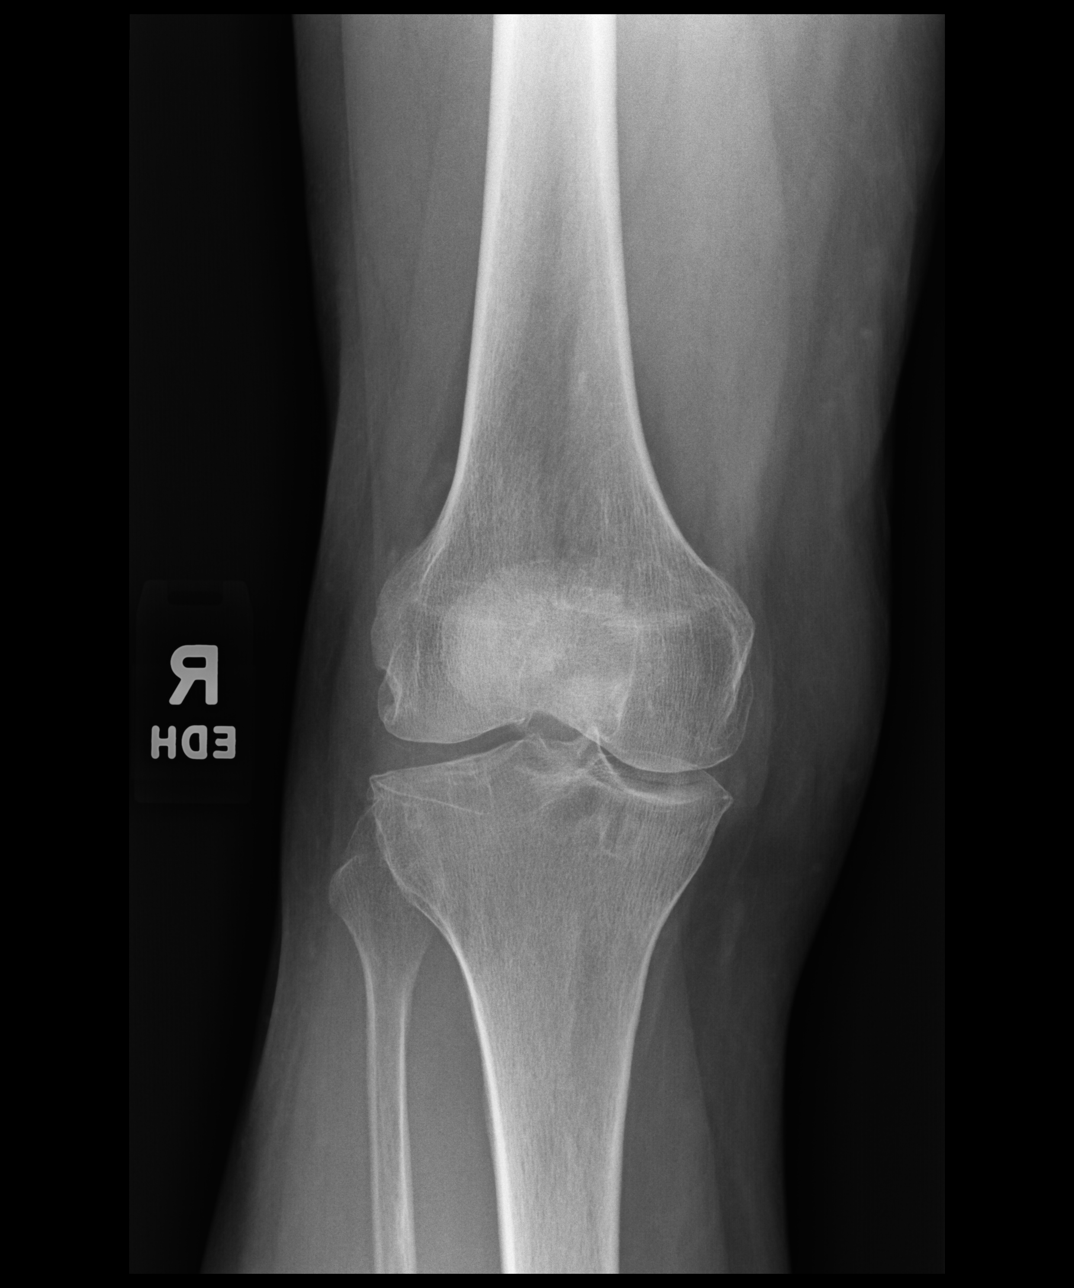

[dg knee complete 4 views right (4 of 4)]
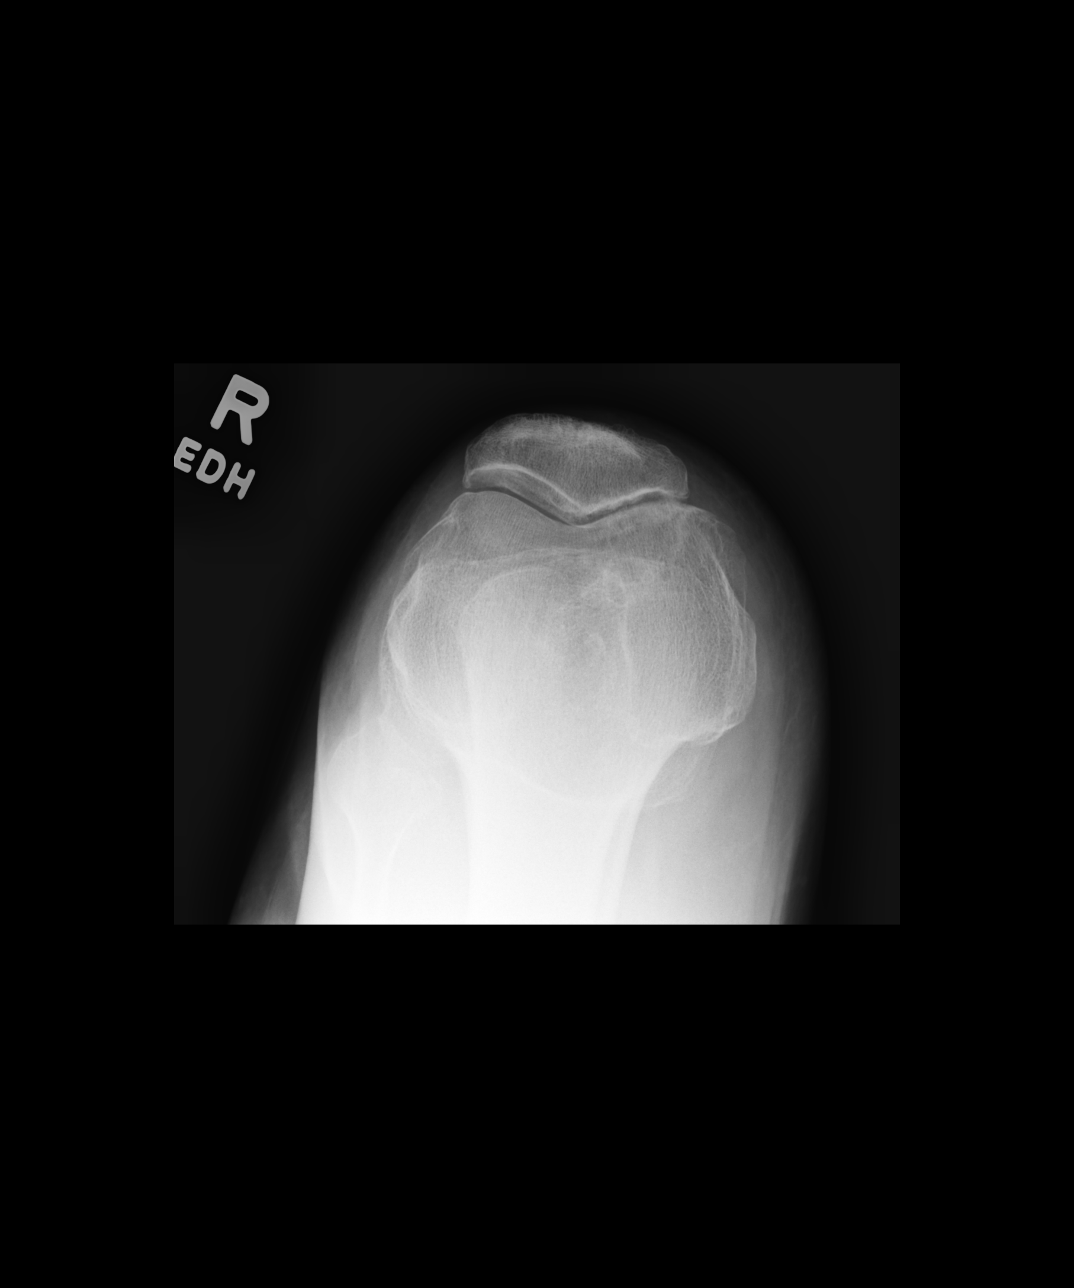

[4 of 4 positions shown; findings below may reference images not displayed]

FINDINGS: Mild to moderate medial joint space narrowing and associated spur
formation with little change. Mild medial spur formation without
significant change and marked patellofemoral joint space narrowing
with mildly progressive mild to moderate posterior patellar spur
formation. No effusion.
IMPRESSION: Mildly progressive tricompartmental degenerative changes.

## 2021-03-15 DIAGNOSIS — F4322 Adjustment disorder with anxiety: Secondary | ICD-10-CM | POA: Diagnosis not present

## 2021-03-22 DIAGNOSIS — H348322 Tributary (branch) retinal vein occlusion, left eye, stable: Secondary | ICD-10-CM | POA: Diagnosis not present

## 2021-03-22 DIAGNOSIS — R748 Abnormal levels of other serum enzymes: Secondary | ICD-10-CM | POA: Diagnosis not present

## 2021-03-25 ENCOUNTER — Ambulatory Visit: Payer: Medicare PPO | Admitting: Sports Medicine

## 2021-03-25 VITALS — BP 131/77 | HR 62 | Ht 65.75 in | Wt 153.0 lb

## 2021-03-25 DIAGNOSIS — M25579 Pain in unspecified ankle and joints of unspecified foot: Secondary | ICD-10-CM | POA: Diagnosis not present

## 2021-03-25 NOTE — Progress Notes (Signed)
PCP: Maurice Small, MD  Subjective:   HPI: Patient is a 66 y.o. female here for bilateral foot pain.  Had foot x rays done in 2018 showing osteoarthritis: First MTP of PIP/DIP narrowing. No intertarsal joint space narrowing,  Dorsal spur, small posterior calcaneal spur.  In L foot she endorses pain along medial aspect of foot- electrical pain that can wake her up in the middle of the night. Has tried icing. Rubbing it helps calm it down. If foot relaxed and pointed its worse, but if curls it it up and props it while flexed it helps relieve some of the pain. In R foot similar type of pain not as severe but did have a bad flare last week. Not as frequent. Originates in first toe and moves down about 2 inches. Pain in both has been ongoing for years. When gets back from walking, both feet feel like they are encased in concrete. Entire foot burns. She states she has been wearing [employer] wide shoes since fall of 2018. Was told she had flying toes and had insole placed. Started wearing Hokas on Monday of this week. She states she walks every day 6-7 days a week between 2-4 miles, and trains with trainer twice a week, yoga once a week, dance fitness class once a week.   Had custom orthotics made years ago but does not fit her feet anymore  Objective:  Physical Exam:  Gen: NAD, comfortable in exam room  R and L Feet: Inspection:  Bossing at dorsum of midfoot bilaterally. No swelling, erythema, or bruising b/l.  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 Full ROM of the ankle b/l. Normal midfoot flexibility b/l Strength: 5/5 strength ankle in all planes b/l Neurovascular: N/V intact distally in the lower extremity b/l Special tests: Negative anterior drawer. Negative squeeze. normal midfoot flexibility. Normal calcaneal motion with heel raise    Assessment & Plan:  1. Bilateral foot pain   Patient endorses chronic bilateral foot pain, with  sharp intermittent pain along medial aspect of both feet extending from first digit, as well as a stiff and heavy pain worsened after walking. She had foot x rays done in 2018 showing osteoarthritis. Physical exam significant for bossing at dorsum of midfoot bilaterally due to bone spurs as well as hallux rigidus of R 1st digit. Pain likely multifactorial with nerve pain contributing as well as arthritis given that heavy stiff feeling she gets after her walks.  Discussed starting nerve medication such as gabapentin, but patient prefers not to at this time.  Recommended Voltaren gel or capsaicin cream for her foot pain also recommended Strassburg socks to wear at night which may help with some of the nerve pain as well.  Patient will return in the following week for custom orthotics.   Patient seen and evaluated with the resident.  I agree with the above plan of care.  Patient has known osteoarthritis of both feet as well as of the right MTP joint.  Her current symptoms sound neuropathic although the exact nerve etiology is unknown.  I recommended that we start with custom orthotics.  She will schedule this for sometime in the near future.

## 2021-03-25 NOTE — Patient Instructions (Addendum)
It was good seeing you today!  As we discussed we recommend trying topical Voltaren gel or Capsaicin cream for your foot pain. You can try the Strassburg sock at night to see if that helps with the nerve pain as well.   We will also get you scheduled to get your custom orthotics made.  Take care!

## 2021-03-30 ENCOUNTER — Ambulatory Visit (INDEPENDENT_AMBULATORY_CARE_PROVIDER_SITE_OTHER): Payer: Medicare PPO | Admitting: Sports Medicine

## 2021-03-30 ENCOUNTER — Other Ambulatory Visit: Payer: Self-pay

## 2021-03-30 VITALS — BP 128/74 | Ht 65.5 in | Wt 153.0 lb

## 2021-03-30 DIAGNOSIS — M25579 Pain in unspecified ankle and joints of unspecified foot: Secondary | ICD-10-CM

## 2021-03-31 NOTE — Progress Notes (Signed)
Patient ID: Mindy Juarez, female   DOB: [birth year], 66 y.o.   MRN: [id number]  Taleia presents today for custom orthotics.  Please see the office note from March 25, 2021 for details regarding history and physical exam findings.  Orthotics were constructed as below.  She will follow-up with me again in 4 weeks for reevaluation.  Patient was fitted for a : standard, cushioned, semi-rigid orthotic. The orthotic was heated and afterward the patient stood on the orthotic blank positioned on the orthotic stand. The patient was positioned in subtalar neutral position and 10 degrees of ankle dorsiflexion in a weight bearing stance. After completion of molding, a stable base was applied to the orthotic blank. The blank was ground to a stable position for weight bearing. Size: 9 Base: Blue EVA Posting: none Additional orthotic padding: none

## 2021-04-12 DIAGNOSIS — F4322 Adjustment disorder with anxiety: Secondary | ICD-10-CM | POA: Diagnosis not present

## 2021-04-13 DIAGNOSIS — Z1231 Encounter for screening mammogram for malignant neoplasm of breast: Secondary | ICD-10-CM | POA: Diagnosis not present

## 2021-04-27 ENCOUNTER — Ambulatory Visit (INDEPENDENT_AMBULATORY_CARE_PROVIDER_SITE_OTHER): Payer: Medicare PPO | Admitting: Sports Medicine

## 2021-04-27 VITALS — BP 124/82 | Ht 65.75 in

## 2021-04-27 DIAGNOSIS — M25579 Pain in unspecified ankle and joints of unspecified foot: Secondary | ICD-10-CM | POA: Diagnosis not present

## 2021-04-27 DIAGNOSIS — H34832 Tributary (branch) retinal vein occlusion, left eye, with macular edema: Secondary | ICD-10-CM | POA: Diagnosis not present

## 2021-04-27 DIAGNOSIS — H35033 Hypertensive retinopathy, bilateral: Secondary | ICD-10-CM | POA: Diagnosis not present

## 2021-04-27 DIAGNOSIS — H3562 Retinal hemorrhage, left eye: Secondary | ICD-10-CM | POA: Diagnosis not present

## 2021-04-27 DIAGNOSIS — H43813 Vitreous degeneration, bilateral: Secondary | ICD-10-CM | POA: Diagnosis not present

## 2021-04-28 DIAGNOSIS — L820 Inflamed seborrheic keratosis: Secondary | ICD-10-CM | POA: Diagnosis not present

## 2021-04-28 NOTE — Progress Notes (Signed)
Patient ID: [name], female   DOB: 1955/11/23, 66 y.o.   MRN: [id number] ? ?Mindy Juarez presents today for adjustments to her orthotics.  Orthotics have helped with her foot pain but she has noticed that the custom orthotics tend to push her heel out of the back of her shoes, specifically her Hoka's.  She would also like a little bit more in the way of longitudinal and transverse arch support. I shaved down the Blue EVA at the heels bilaterally and added scaphoid pads and metatarsal pads to both orthotics.  Evaluation of her gait after all of these adjustments did show a more neutral stance and good control of pronation (slight pronation had been noticed with her orthotics in place prior to adding the scaphoid pads).  She will let me know if any further adjustments are needed. ?

## 2021-04-29 ENCOUNTER — Ambulatory Visit: Payer: Medicare PPO | Admitting: Sports Medicine

## 2021-05-10 DIAGNOSIS — F4322 Adjustment disorder with anxiety: Secondary | ICD-10-CM | POA: Diagnosis not present

## 2021-05-18 ENCOUNTER — Ambulatory Visit: Payer: Medicare PPO | Admitting: Sports Medicine

## 2021-05-18 VITALS — BP 140/84 | Ht 65.75 in | Wt 153.0 lb

## 2021-05-18 DIAGNOSIS — M25579 Pain in unspecified ankle and joints of unspecified foot: Secondary | ICD-10-CM | POA: Diagnosis not present

## 2021-05-18 NOTE — Progress Notes (Signed)
PCP: Maurice Small, MD ? ?Subjective:  ? ?HPI: ?Patient is a 66 y.o. female here for follow up of bilateral foot pain. ? ?She had foot x rays done in 2018 showing osteoarthritis. Patient was seen at this office in Feb 2023 for chronic bilateral foot pain, with sharp intermittent pain along medial aspect of both feet extending from first digit, as well as a stiff and heavy pain worsened after walking. Pain was thought to be likely multifactorial with nerve pain contributing as well as arthritis. Patient declined a trial of gabapentin at that time. Also decided against using Voltaren gel, capsaicin cream, or Strassburg socks. Patient did have custom orthotics made and then adjusted three weeks ago to shave the height and add scaphoid and metatarsal pads.  ? ?Today she reports continued discomfort with walking. She feels very cramped in her shoes and heavy "frankenstein" feet. She also feels like her heels are going to come out of her shoes due to the height of the orthotics. Overall, she reports that her symptoms have not improved much although she does think that the addition of metatarsal pads was beneficial. She sometimes feels as if her left foot wants to pronate when she bears weight on it, so she feels that the scaphoid pads may be helping that as well.  ? ? ?Past Medical History:  ?Diagnosis Date  ? Elevated liver enzymes 2018  ? History of echocardiogram   ? Echo 10/18: EF 60-65, normal wall motion, normal diastolic function, mild LAE  ? Osteoarthritis   ? +ANA; followed by Rheumatology Estanislado Pandy)  ? Osteoporosis 05/16/2016  ? Vitamin D deficiency   ? ? ?Current Outpatient Medications on File Prior to Visit  ?Medication Sig Dispense Refill  ? Calcium Citrate 250 MG TABS Take 1 tablet by mouth daily.     ? cholecalciferol (VITAMIN D) 1000 UNITS tablet Take 5,000 Units by mouth 3 (three) times a week.     ? fexofenadine (ALLEGRA) 180 MG tablet Take 180 mg by mouth daily as needed.     ? glucosamine-chondroitin  500-400 MG tablet Take 3 tablets by mouth daily. 1500/1200    ? Misc Natural Products (TART CHERRY ADVANCED PO) Take 800 mg by mouth daily.     ? Multiple Vitamins-Minerals (MULTIVITAMIN WITH MINERALS) tablet Take 1 tablet by mouth daily.    ? mupirocin cream (BACTROBAN) 2 % Apply 1 application topically 3 (three) times daily as needed.     ? naproxen (NAPROSYN) 500 MG tablet Take 1 tablet twice daily with food for 7 days. Then take as needed. (Patient not taking: Reported on 03/30/2017) 40 tablet 0  ? Omega-3 Fatty Acids (FISH OIL PO) Take 448 mg by mouth. Two tablets 3 days per week    ? ?No current facility-administered medications on file prior to visit.  ? ? ?Past Surgical History:  ?Procedure Laterality Date  ? CARPAL TUNNEL RELEASE Right 2009  ? [place]  ? HIP ARTHROSCOPY W/ LABRAL REPAIR Left 2007  ? TRIGGER FINGER RELEASE    ? thumb  ? ? ?Allergies  ?Allergen Reactions  ? Tramadol Other (See Comments)  ?  Dizziness, fatigue  ? Codeine   ? ? ?LMP 11/08/2006 (Approximate)  ? ?   ? View : No data to display.  ?  ?  ?  ? ? ?   ? View : No data to display.  ?  ?  ?  ? ? ?    ?Objective:  ?Physical  Exam: ? ?Gen: NAD, comfortable in exam room ?CV: Regular rate, well perfused ?Resp: No increased work of breathing, coughing or wheezing ?Psych: Normal mood and affect.  ?Feet: No swelling or bruising. Normal arches. No tenderness to palpation. Full ROM in the ankle. Good strength in plantar/dorsiflexion. Negative squeeze. Good capillary refill. Neurovascularly intact distally.  ?  ?Assessment & Plan:  ?1. Bilateral foot pain - Overall, patient continues to deal with similar chronic bilateral foot pain, with sharp intermittent pain along medial aspect of both feet extending from first digit, as well as a stiff and heavy pain worsened after walking. She likely has significant contribution from OA. She did have a specific complaint of her heels not seating well in her shoes with the orthotics, so we  aggressively shaved down the blue EVA of her bilateral orthotics. Patient was observed more comfortably with walking with the modifications. She will test out these modifications for the next two days and contact me with the results. We may consider a referral to a foot and ankle specialist for a second opinion at some point in the future.  ? ? ? ?Donald Pore ?MS4, [employer] ? ?Patient seen and evaluated with the medical student.  I agree with the above plan of care.  Further modifications were made to her orthotics, specifically an aggressive shaving of her Blue EVA to help combat her feeling of her heels being lifted out of her shoes.  This did seem to help with that problem.  I have asked her to contact me in approximately 48 hours with an update.  If this additional modification is not helpful then I would like to refer her to Dr. Lucia Gaskins for his input. ?

## 2021-05-24 ENCOUNTER — Encounter: Payer: Self-pay | Admitting: Sports Medicine

## 2021-05-28 DIAGNOSIS — M79672 Pain in left foot: Secondary | ICD-10-CM | POA: Diagnosis not present

## 2021-05-28 DIAGNOSIS — M79671 Pain in right foot: Secondary | ICD-10-CM | POA: Diagnosis not present

## 2021-06-07 DIAGNOSIS — F4322 Adjustment disorder with anxiety: Secondary | ICD-10-CM | POA: Diagnosis not present

## 2021-06-14 DIAGNOSIS — M79671 Pain in right foot: Secondary | ICD-10-CM | POA: Diagnosis not present

## 2021-06-14 DIAGNOSIS — M25552 Pain in left hip: Secondary | ICD-10-CM | POA: Diagnosis not present

## 2021-06-14 DIAGNOSIS — M79672 Pain in left foot: Secondary | ICD-10-CM | POA: Diagnosis not present

## 2021-06-14 DIAGNOSIS — M169 Osteoarthritis of hip, unspecified: Secondary | ICD-10-CM | POA: Diagnosis not present

## 2021-06-24 DIAGNOSIS — M79671 Pain in right foot: Secondary | ICD-10-CM | POA: Diagnosis not present

## 2021-06-24 DIAGNOSIS — M25552 Pain in left hip: Secondary | ICD-10-CM | POA: Diagnosis not present

## 2021-06-24 DIAGNOSIS — M169 Osteoarthritis of hip, unspecified: Secondary | ICD-10-CM | POA: Diagnosis not present

## 2021-06-24 DIAGNOSIS — M79672 Pain in left foot: Secondary | ICD-10-CM | POA: Diagnosis not present

## 2021-07-01 DIAGNOSIS — M79672 Pain in left foot: Secondary | ICD-10-CM | POA: Diagnosis not present

## 2021-07-01 DIAGNOSIS — M79671 Pain in right foot: Secondary | ICD-10-CM | POA: Diagnosis not present

## 2021-07-01 DIAGNOSIS — M169 Osteoarthritis of hip, unspecified: Secondary | ICD-10-CM | POA: Diagnosis not present

## 2021-07-01 DIAGNOSIS — M25552 Pain in left hip: Secondary | ICD-10-CM | POA: Diagnosis not present

## 2021-07-08 DIAGNOSIS — M79672 Pain in left foot: Secondary | ICD-10-CM | POA: Diagnosis not present

## 2021-07-08 DIAGNOSIS — M169 Osteoarthritis of hip, unspecified: Secondary | ICD-10-CM | POA: Diagnosis not present

## 2021-07-08 DIAGNOSIS — M25552 Pain in left hip: Secondary | ICD-10-CM | POA: Diagnosis not present

## 2021-07-08 DIAGNOSIS — M79671 Pain in right foot: Secondary | ICD-10-CM | POA: Diagnosis not present

## 2021-07-09 DIAGNOSIS — M2021 Hallux rigidus, right foot: Secondary | ICD-10-CM | POA: Diagnosis not present

## 2021-07-09 DIAGNOSIS — M19071 Primary osteoarthritis, right ankle and foot: Secondary | ICD-10-CM | POA: Diagnosis not present

## 2021-07-22 DIAGNOSIS — Z23 Encounter for immunization: Secondary | ICD-10-CM | POA: Diagnosis not present

## 2021-07-22 DIAGNOSIS — I10 Essential (primary) hypertension: Secondary | ICD-10-CM | POA: Diagnosis not present

## 2021-07-22 DIAGNOSIS — Z136 Encounter for screening for cardiovascular disorders: Secondary | ICD-10-CM | POA: Diagnosis not present

## 2021-07-22 DIAGNOSIS — R635 Abnormal weight gain: Secondary | ICD-10-CM | POA: Diagnosis not present

## 2021-07-22 DIAGNOSIS — Z0000 Encounter for general adult medical examination without abnormal findings: Secondary | ICD-10-CM | POA: Diagnosis not present

## 2021-07-22 DIAGNOSIS — E559 Vitamin D deficiency, unspecified: Secondary | ICD-10-CM | POA: Diagnosis not present

## 2021-07-22 DIAGNOSIS — M810 Age-related osteoporosis without current pathological fracture: Secondary | ICD-10-CM | POA: Diagnosis not present

## 2021-07-22 DIAGNOSIS — Z13220 Encounter for screening for lipoid disorders: Secondary | ICD-10-CM | POA: Diagnosis not present

## 2021-07-23 ENCOUNTER — Other Ambulatory Visit: Payer: Self-pay | Admitting: Family Medicine

## 2021-07-23 DIAGNOSIS — I10 Essential (primary) hypertension: Secondary | ICD-10-CM

## 2021-07-27 ENCOUNTER — Ambulatory Visit
Admission: RE | Admit: 2021-07-27 | Discharge: 2021-07-27 | Disposition: A | Discharge status: Home / Self Care | Payer: Medicare PPO | Source: Ambulatory Visit | Attending: Family Medicine | Admitting: Family Medicine

## 2021-07-27 DIAGNOSIS — H2513 Age-related nuclear cataract, bilateral: Secondary | ICD-10-CM | POA: Diagnosis not present

## 2021-07-27 DIAGNOSIS — I10 Essential (primary) hypertension: Secondary | ICD-10-CM

## 2021-07-27 DIAGNOSIS — H34832 Tributary (branch) retinal vein occlusion, left eye, with macular edema: Secondary | ICD-10-CM | POA: Diagnosis not present

## 2021-07-27 DIAGNOSIS — H25013 Cortical age-related cataract, bilateral: Secondary | ICD-10-CM | POA: Diagnosis not present

## 2021-07-27 DIAGNOSIS — H524 Presbyopia: Secondary | ICD-10-CM | POA: Diagnosis not present

## 2021-08-02 ENCOUNTER — Other Ambulatory Visit: Payer: Self-pay | Admitting: Family Medicine

## 2021-08-02 DIAGNOSIS — I10 Essential (primary) hypertension: Secondary | ICD-10-CM

## 2021-08-03 DIAGNOSIS — M85852 Other specified disorders of bone density and structure, left thigh: Secondary | ICD-10-CM | POA: Diagnosis not present

## 2021-08-03 DIAGNOSIS — M810 Age-related osteoporosis without current pathological fracture: Secondary | ICD-10-CM | POA: Diagnosis not present

## 2021-08-11 ENCOUNTER — Telehealth: Payer: Self-pay

## 2021-08-11 ENCOUNTER — Other Ambulatory Visit: Payer: Medicare PPO

## 2021-08-11 NOTE — Telephone Encounter (Signed)
Pt is requesting to become a new pt of Dr. Quay Burow. Personal friends of Dr. Camelia Phenes which recommended her to see Dr. Quay Burow.  Please advise

## 2021-08-12 ENCOUNTER — Ambulatory Visit: Payer: Medicare PPO | Admitting: Sports Medicine

## 2021-08-12 VITALS — BP 124/82 | Ht 65.25 in | Wt 153.0 lb

## 2021-08-12 DIAGNOSIS — M810 Age-related osteoporosis without current pathological fracture: Secondary | ICD-10-CM

## 2021-08-12 DIAGNOSIS — R269 Unspecified abnormalities of gait and mobility: Secondary | ICD-10-CM

## 2021-08-13 NOTE — Progress Notes (Signed)
Patient ID: Mindy Juarez, female   DOB: 21-Jun-1955, 66 y.o.   MRN: [id number]  Mindy Juarez presents today to discuss a couple of different things.  First, she has been diagnosed with osteoporosis after her recent DEXA scan.  Her nurse practitioner recommended that she see an endocrinologist but Mindy Juarez is asking me if I think that is necessary.  She is very well educated when it comes to osteoporosis as her mother had this.  In fact, Mindy Juarez has scheduled an appointment with the osteoporosis clinic at [facility] in lieu of her appointment with the endocrinologist.  I did personally review her DEXA scan.  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  I agree with Mindy Juarez that the appointment with endocrinology is unnecessary and I think that she would benefit more from her appointment with the osteoporosis clinic at [facility].  She is also here to discuss her orthotics.  At her last office visit we added a scaphoid pad to help correct some pronation that she was still experiencing even with her orthotics in place.  That pad was uncomfortable so she has since removed it.  She is currently wearing Hoka's but is planning on purchasing new shoes soon.  She is asking me my opinion about this.  I have recommended that she try on either a stability shoe or a motion control shoe that will easily accommodate her custom orthotic.  The hope is that the additional support from this type of shoe will help correct her significant pronation.  If this is not helpful or she finds this to be uncomfortable, then we will likely construct new orthotics.  Total time spent with [name] was approximately 20 minutes  This note was dictated using Dragon naturally speaking software and may contain errors in syntax, spelling, or content which have not been identified prior to signing this note.

## 2021-08-16 NOTE — Telephone Encounter (Signed)
Appointment made and my-chart sent with appointment info.

## 2021-08-16 NOTE — Telephone Encounter (Signed)
Pt is requesting a callback to reschedule her new pt appt. Pt stated she is available 8/28-8/31.    Please advise  [phone]

## 2021-08-23 DIAGNOSIS — H43813 Vitreous degeneration, bilateral: Secondary | ICD-10-CM | POA: Diagnosis not present

## 2021-08-23 DIAGNOSIS — H35033 Hypertensive retinopathy, bilateral: Secondary | ICD-10-CM | POA: Diagnosis not present

## 2021-08-23 DIAGNOSIS — H34832 Tributary (branch) retinal vein occlusion, left eye, with macular edema: Secondary | ICD-10-CM | POA: Diagnosis not present

## 2021-08-23 DIAGNOSIS — H3562 Retinal hemorrhage, left eye: Secondary | ICD-10-CM | POA: Diagnosis not present

## 2021-09-16 DIAGNOSIS — L814 Other melanin hyperpigmentation: Secondary | ICD-10-CM | POA: Insufficient documentation

## 2021-09-16 DIAGNOSIS — L821 Other seborrheic keratosis: Secondary | ICD-10-CM | POA: Insufficient documentation

## 2021-09-16 DIAGNOSIS — Q828 Other specified congenital malformations of skin: Secondary | ICD-10-CM | POA: Insufficient documentation

## 2021-09-16 DIAGNOSIS — M810 Age-related osteoporosis without current pathological fracture: Secondary | ICD-10-CM | POA: Diagnosis not present

## 2021-09-16 DIAGNOSIS — H35039 Hypertensive retinopathy, unspecified eye: Secondary | ICD-10-CM | POA: Insufficient documentation

## 2021-09-16 DIAGNOSIS — H348392 Tributary (branch) retinal vein occlusion, unspecified eye, stable: Secondary | ICD-10-CM | POA: Insufficient documentation

## 2021-09-16 DIAGNOSIS — Z8262 Family history of osteoporosis: Secondary | ICD-10-CM | POA: Diagnosis not present

## 2021-09-16 DIAGNOSIS — M1990 Unspecified osteoarthritis, unspecified site: Secondary | ICD-10-CM | POA: Diagnosis not present

## 2021-09-16 DIAGNOSIS — D225 Melanocytic nevi of trunk: Secondary | ICD-10-CM | POA: Insufficient documentation

## 2021-09-16 DIAGNOSIS — L301 Dyshidrosis [pompholyx]: Secondary | ICD-10-CM | POA: Insufficient documentation

## 2021-09-16 DIAGNOSIS — I10 Essential (primary) hypertension: Secondary | ICD-10-CM | POA: Insufficient documentation

## 2021-09-16 DIAGNOSIS — L309 Dermatitis, unspecified: Secondary | ICD-10-CM | POA: Insufficient documentation

## 2021-09-16 DIAGNOSIS — D1801 Hemangioma of skin and subcutaneous tissue: Secondary | ICD-10-CM | POA: Insufficient documentation

## 2021-09-29 ENCOUNTER — Ambulatory Visit: Payer: Medicare PPO | Admitting: Internal Medicine

## 2021-10-04 NOTE — Progress Notes (Unsigned)
Subjective:    Patient ID: Mindy Juarez, female    DOB: Jun 25, 1955, 66 y.o.   MRN: [id number]     HPI She is here to establish with a new pcp.  Shailah is here for follow up of her chronic medical problems, including hypertension, osteoporosis, osteoarthritis  OA in feet, hands, shoulder, neck and knees  Concern with weight  - goal weight 144-147-has difficulty restricting some things in her diet.  Increased stress since her mother passed last year- affecting sleep, weight gain    Medications and allergies reviewed with patient and updated if appropriate.  Current Outpatient Medications on File Prior to Visit  Medication Sig Dispense Refill   amLODipine (NORVASC) 5 MG tablet Take 1 tablet by mouth daily.     Calcium Citrate 250 MG TABS Take 1 tablet by mouth daily.      cholecalciferol (VITAMIN D) 1000 UNITS tablet Take 5,000 Units by mouth 3 (three) times a week.      glucosamine-chondroitin 500-400 MG tablet Take 3 tablets by mouth daily. 1500/1200     Misc Natural Products (TART CHERRY ADVANCED PO) Take 800 mg by mouth daily.      Multiple Vitamins-Minerals (MULTIVITAMIN WITH MINERALS) tablet Take 1 tablet by mouth daily.     Omega-3 Fatty Acids (FISH OIL PO) Take 448 mg by mouth. Two tablets 3 days per week     No current facility-administered medications on file prior to visit.     Review of Systems  Constitutional:  Negative for fever.  Respiratory:  Negative for cough, shortness of breath and wheezing.   Cardiovascular:  Negative for chest pain, palpitations and leg swelling.  Gastrointestinal:  Negative for abdominal pain, constipation and diarrhea.       No gerd  Musculoskeletal:  Positive for arthralgias.  Neurological:  Negative for dizziness, light-headedness and headaches.       Objective:   Vitals:   10/05/21 1103  BP: 120/76  Pulse: 70  Temp: 98.4 F (36.9 C)  SpO2: 98%   BP Readings from Last 3 Encounters:  10/05/21 120/76  08/12/21  124/82  05/18/21 140/84   Wt Readings from Last 3 Encounters:  10/05/21 155 lb (70.3 kg)  08/12/21 153 lb (69.4 kg)  05/18/21 153 lb (69.4 kg)   Body mass index is 25.6 kg/m.    Physical Exam Constitutional:      General: She is not in acute distress.    Appearance: Normal appearance.  HENT:     Head: Normocephalic and atraumatic.  Eyes:     Conjunctiva/sclera: Conjunctivae normal.  Cardiovascular:     Rate and Rhythm: Normal rate and regular rhythm.     Heart sounds: Normal heart sounds. No murmur heard. Pulmonary:     Effort: Pulmonary effort is normal. No respiratory distress.     Breath sounds: Normal breath sounds. No wheezing.  Abdominal:     General: There is no distension.     Palpations: Abdomen is soft.     Tenderness: There is no abdominal tenderness.  Musculoskeletal:     Cervical back: Neck supple.     Right lower leg: No edema.     Left lower leg: No edema.  Lymphadenopathy:     Cervical: No cervical adenopathy.  Skin:    General: Skin is warm and dry.     Findings: No rash.  Neurological:     Mental Status: She is alert. Mental status is at baseline.  Psychiatric:  Mood and Affect: Mood normal.        Behavior: Behavior normal.        Lab Results  Component Value Date   WBC 5.0 09/13/2016   HGB 13.1 09/13/2016   HCT 39.5 09/13/2016   PLT 224 09/13/2016   GLUCOSE 93 09/13/2016   CHOL 159 05/29/2015   TRIG 35 05/29/2015   HDL 91 05/29/2015   LDLCALC 61 05/29/2015   ALT 42 (H) 09/13/2016   AST 35 09/13/2016   NA 140 09/13/2016   K 4.3 09/13/2016   CL 104 09/13/2016   CREATININE 0.64 09/13/2016   BUN 14 09/13/2016   CO2 24 09/13/2016   TSH 2.54 09/13/2016   HGBA1C 5.5 06/10/2016     Assessment & Plan:    See Problem List for Assessment and Plan of chronic medical problems.

## 2021-10-05 ENCOUNTER — Encounter: Payer: Self-pay | Admitting: Internal Medicine

## 2021-10-05 ENCOUNTER — Ambulatory Visit: Payer: Medicare PPO | Admitting: Internal Medicine

## 2021-10-05 VITALS — BP 120/76 | HR 70 | Temp 98.4°F | Ht 65.25 in | Wt 155.0 lb

## 2021-10-05 DIAGNOSIS — M159 Polyosteoarthritis, unspecified: Secondary | ICD-10-CM

## 2021-10-05 DIAGNOSIS — I10 Essential (primary) hypertension: Secondary | ICD-10-CM

## 2021-10-05 DIAGNOSIS — M810 Age-related osteoporosis without current pathological fracture: Secondary | ICD-10-CM | POA: Diagnosis not present

## 2021-10-05 NOTE — Patient Instructions (Addendum)
     Medications changes include :   none    Your prescription(s) have been sent to your pharmacy.     Return in about 6 months (around 04/07/2022) for follow up, hypertension.

## 2021-10-05 NOTE — Assessment & Plan Note (Signed)
Chronic Going to [place] [facility] [employer] To start Evenity this week Taking calcium and vitamin d daily Exercising daily

## 2021-10-05 NOTE — Assessment & Plan Note (Signed)
Chronic See sports medicine, orthopedics as needed Osteoarthritis of hands, shoulder, neck, knees, feet Rarely takes OTC medication Exercises regularly Takes supplements

## 2021-10-05 NOTE — Assessment & Plan Note (Signed)
Chronic Started on amlodipine in February of this year Blood pressure here today well controlled Blood pressure at home is variable-on average controlled Continue amlodipine 5 mg daily She will continue to monitor BP at home-does not need to check forearms and does not necessarily have to check every single day Continue regular exercise, low-sodium diet

## 2021-10-07 DIAGNOSIS — M810 Age-related osteoporosis without current pathological fracture: Secondary | ICD-10-CM | POA: Diagnosis not present

## 2021-10-07 DIAGNOSIS — Z79899 Other long term (current) drug therapy: Secondary | ICD-10-CM | POA: Diagnosis not present

## 2021-11-01 DIAGNOSIS — K130 Diseases of lips: Secondary | ICD-10-CM | POA: Diagnosis not present

## 2021-11-09 DIAGNOSIS — M810 Age-related osteoporosis without current pathological fracture: Secondary | ICD-10-CM | POA: Diagnosis not present

## 2021-12-14 DIAGNOSIS — M810 Age-related osteoporosis without current pathological fracture: Secondary | ICD-10-CM | POA: Diagnosis not present

## 2022-01-11 DIAGNOSIS — Z85828 Personal history of other malignant neoplasm of skin: Secondary | ICD-10-CM | POA: Diagnosis not present

## 2022-01-11 DIAGNOSIS — D229 Melanocytic nevi, unspecified: Secondary | ICD-10-CM | POA: Diagnosis not present

## 2022-01-11 DIAGNOSIS — L578 Other skin changes due to chronic exposure to nonionizing radiation: Secondary | ICD-10-CM | POA: Diagnosis not present

## 2022-01-11 DIAGNOSIS — L814 Other melanin hyperpigmentation: Secondary | ICD-10-CM | POA: Diagnosis not present

## 2022-01-11 DIAGNOSIS — K130 Diseases of lips: Secondary | ICD-10-CM | POA: Diagnosis not present

## 2022-01-11 DIAGNOSIS — L821 Other seborrheic keratosis: Secondary | ICD-10-CM | POA: Diagnosis not present

## 2022-01-20 DIAGNOSIS — M8589 Other specified disorders of bone density and structure, multiple sites: Secondary | ICD-10-CM | POA: Diagnosis not present

## 2022-01-20 DIAGNOSIS — Z5181 Encounter for therapeutic drug level monitoring: Secondary | ICD-10-CM | POA: Diagnosis not present

## 2022-01-20 DIAGNOSIS — M810 Age-related osteoporosis without current pathological fracture: Secondary | ICD-10-CM | POA: Diagnosis not present

## 2022-01-20 DIAGNOSIS — R768 Other specified abnormal immunological findings in serum: Secondary | ICD-10-CM | POA: Diagnosis not present

## 2022-01-20 DIAGNOSIS — Z8262 Family history of osteoporosis: Secondary | ICD-10-CM | POA: Diagnosis not present

## 2022-01-20 DIAGNOSIS — Z79899 Other long term (current) drug therapy: Secondary | ICD-10-CM | POA: Diagnosis not present

## 2022-02-25 DIAGNOSIS — Z79620 Long term (current) use of immunosuppressive biologic: Secondary | ICD-10-CM | POA: Diagnosis not present

## 2022-02-25 DIAGNOSIS — M810 Age-related osteoporosis without current pathological fracture: Secondary | ICD-10-CM | POA: Diagnosis not present

## 2022-02-28 DIAGNOSIS — H43813 Vitreous degeneration, bilateral: Secondary | ICD-10-CM | POA: Diagnosis not present

## 2022-02-28 DIAGNOSIS — H35033 Hypertensive retinopathy, bilateral: Secondary | ICD-10-CM | POA: Diagnosis not present

## 2022-02-28 DIAGNOSIS — H34832 Tributary (branch) retinal vein occlusion, left eye, with macular edema: Secondary | ICD-10-CM | POA: Diagnosis not present

## 2022-03-03 ENCOUNTER — Encounter: Payer: Self-pay | Admitting: Internal Medicine

## 2022-03-03 NOTE — Progress Notes (Signed)
Outside notes received. Information abstracted. Notes sent to scan. 

## 2022-03-04 ENCOUNTER — Other Ambulatory Visit: Payer: Self-pay | Admitting: Internal Medicine

## 2022-03-30 DIAGNOSIS — M810 Age-related osteoporosis without current pathological fracture: Secondary | ICD-10-CM | POA: Diagnosis not present

## 2022-04-06 ENCOUNTER — Encounter: Payer: Self-pay | Admitting: Internal Medicine

## 2022-04-06 NOTE — Progress Notes (Unsigned)
Subjective:    Patient ID: Mindy Juarez, female    DOB: 1955-09-18, 67 y.o.   MRN: [id number]     HPI Mindy Juarez is here for follow up of her chronic medical problems, including htn, OP, generalized OA  BP at home - average is good range 118-147  Never really had headaches.  In recent weeks has had a shadow of a headache.  Has not had to take anything.  Usually goes away.  No pattern.   Not true dizziness, if turns quick has a moment of unsteadiness.  Has some balance issues - related to knee OA, feet OA, left hip bursitis.  She works on balance.    When trips it is always on left foot  Has had 6 treatments of evenity.    Medications and allergies reviewed with patient and updated if appropriate.  Current Outpatient Medications on File Prior to Visit  Medication Sig Dispense Refill   amLODipine (NORVASC) 5 MG tablet TAKE 1 TABLET BY MOUTH EVERY DAY 90 tablet 2   Calcium Citrate 250 MG TABS Take 1 tablet by mouth daily.      cholecalciferol (VITAMIN D) 1000 UNITS tablet Take 5,000 Units by mouth 3 (three) times a week.      glucosamine-chondroitin 500-400 MG tablet Take 3 tablets by mouth daily. 1500/1200     Misc Natural Products (TART CHERRY ADVANCED PO) Take 800 mg by mouth daily.      Multiple Vitamins-Minerals (MULTIVITAMIN WITH MINERALS) tablet Take 1 tablet by mouth daily.     Omega-3 Fatty Acids (FISH OIL PO) Take 448 mg by mouth. Two tablets 3 days per week     Romosozumab-aqqg (EVENITY) 105 MG/1.17ML SOSY injection Inject 210 mg into the skin once.     No current facility-administered medications on file prior to visit.     Review of Systems  Constitutional:  Negative for fever.  Respiratory:  Negative for cough, shortness of breath and wheezing.   Cardiovascular:  Negative for chest pain, palpitations and leg swelling.  Neurological:  Negative for light-headedness and headaches.       Objective:   Vitals:   04/07/22 1102  BP: 130/78  Pulse: 62  Temp:  97.9 F (36.6 C)  SpO2: 98%   BP Readings from Last 3 Encounters:  04/07/22 130/78  10/05/21 120/76  08/12/21 124/82   Wt Readings from Last 3 Encounters:  04/07/22 155 lb (70.3 kg)  10/05/21 155 lb (70.3 kg)  08/12/21 153 lb (69.4 kg)   Body mass index is 25.6 kg/m.    Physical Exam Constitutional:      General: She is not in acute distress.    Appearance: Normal appearance.  HENT:     Head: Normocephalic and atraumatic.  Eyes:     Conjunctiva/sclera: Conjunctivae normal.  Cardiovascular:     Rate and Rhythm: Normal rate and regular rhythm.     Heart sounds: Normal heart sounds.  Pulmonary:     Effort: Pulmonary effort is normal. No respiratory distress.     Breath sounds: Normal breath sounds. No wheezing.  Musculoskeletal:     Cervical back: Neck supple.     Right lower leg: No edema.     Left lower leg: No edema.  Lymphadenopathy:     Cervical: No cervical adenopathy.  Skin:    General: Skin is warm and dry.     Findings: No rash.  Neurological:     Mental Status: She is alert. Mental  status is at baseline.  Psychiatric:        Mood and Affect: Mood normal.        Behavior: Behavior normal.        Lab Results  Component Value Date   WBC 5.0 09/13/2016   HGB 13.1 09/13/2016   HCT 39.5 09/13/2016   PLT 224 09/13/2016   GLUCOSE 93 09/13/2016   CHOL 159 05/29/2015   TRIG 35 05/29/2015   HDL 91 05/29/2015   LDLCALC 61 05/29/2015   ALT 42 (H) 09/13/2016   AST 35 09/13/2016   NA 140 09/13/2016   K 4.3 09/13/2016   CL 104 09/13/2016   CREATININE 0.64 09/13/2016   BUN 14 09/13/2016   CO2 24 09/13/2016   TSH 2.54 09/13/2016   HGBA1C 5.5 06/10/2016     Assessment & Plan:    See Problem List for Assessment and Plan of chronic medical problems.

## 2022-04-06 NOTE — Patient Instructions (Addendum)
      Blood work was ordered.   The lab is on the first floor.    Medications changes include :       A referral was ordered for XXX.     Someone will call you to schedule an appointment.    Return in about 1 year (around 04/07/2023) for Physical Exam.

## 2022-04-07 ENCOUNTER — Encounter: Payer: Self-pay | Admitting: Internal Medicine

## 2022-04-07 ENCOUNTER — Ambulatory Visit: Payer: Medicare PPO | Admitting: Internal Medicine

## 2022-04-07 VITALS — BP 130/78 | HR 62 | Temp 97.9°F | Ht 65.25 in | Wt 155.0 lb

## 2022-04-07 DIAGNOSIS — M810 Age-related osteoporosis without current pathological fracture: Secondary | ICD-10-CM

## 2022-04-07 DIAGNOSIS — M159 Polyosteoarthritis, unspecified: Secondary | ICD-10-CM

## 2022-04-07 DIAGNOSIS — K130 Diseases of lips: Secondary | ICD-10-CM | POA: Insufficient documentation

## 2022-04-07 DIAGNOSIS — Z85828 Personal history of other malignant neoplasm of skin: Secondary | ICD-10-CM | POA: Insufficient documentation

## 2022-04-07 DIAGNOSIS — L570 Actinic keratosis: Secondary | ICD-10-CM | POA: Insufficient documentation

## 2022-04-07 DIAGNOSIS — I10 Essential (primary) hypertension: Secondary | ICD-10-CM | POA: Diagnosis not present

## 2022-04-07 DIAGNOSIS — R42 Dizziness and giddiness: Secondary | ICD-10-CM

## 2022-04-07 NOTE — Assessment & Plan Note (Signed)
Chronic Mild Over-the-counter medications as needed, but does not take frequently See sports medicine, orthopedics as needed Continue regular exercise

## 2022-04-07 NOTE — Assessment & Plan Note (Addendum)
Chronic Managed by [facility] On evenity - has had 6 injections Continue calcium and vitamin D Exercising regularly

## 2022-04-07 NOTE — Assessment & Plan Note (Signed)
New Has occ transient feeling of unbalanced feeling when making quick movements Likely BPPV - mild No treatment needed Can consider PT if worsens

## 2022-04-07 NOTE — Assessment & Plan Note (Addendum)
Chronic Blood pressure well controlled Continue amlodipine 5 mg daily

## 2022-04-19 DIAGNOSIS — Z1231 Encounter for screening mammogram for malignant neoplasm of breast: Secondary | ICD-10-CM | POA: Diagnosis not present

## 2022-04-19 LAB — HM MAMMOGRAPHY

## 2022-04-22 ENCOUNTER — Encounter: Payer: Self-pay | Admitting: Internal Medicine

## 2022-04-22 NOTE — Progress Notes (Signed)
Outside notes received. Information abstracted. Notes sent to scan.  

## 2022-04-28 DIAGNOSIS — M810 Age-related osteoporosis without current pathological fracture: Secondary | ICD-10-CM | POA: Diagnosis not present

## 2022-06-02 DIAGNOSIS — M810 Age-related osteoporosis without current pathological fracture: Secondary | ICD-10-CM | POA: Diagnosis not present

## 2022-07-06 DIAGNOSIS — M810 Age-related osteoporosis without current pathological fracture: Secondary | ICD-10-CM | POA: Diagnosis not present

## 2022-07-18 ENCOUNTER — Encounter: Payer: Self-pay | Admitting: Internal Medicine

## 2022-08-02 DIAGNOSIS — H2513 Age-related nuclear cataract, bilateral: Secondary | ICD-10-CM | POA: Diagnosis not present

## 2022-08-02 DIAGNOSIS — H35033 Hypertensive retinopathy, bilateral: Secondary | ICD-10-CM | POA: Diagnosis not present

## 2022-08-02 DIAGNOSIS — H34832 Tributary (branch) retinal vein occlusion, left eye, with macular edema: Secondary | ICD-10-CM | POA: Diagnosis not present

## 2022-08-02 DIAGNOSIS — H43813 Vitreous degeneration, bilateral: Secondary | ICD-10-CM | POA: Diagnosis not present

## 2022-08-05 DIAGNOSIS — M810 Age-related osteoporosis without current pathological fracture: Secondary | ICD-10-CM | POA: Diagnosis not present

## 2022-08-26 DIAGNOSIS — H5203 Hypermetropia, bilateral: Secondary | ICD-10-CM | POA: Diagnosis not present

## 2022-08-26 DIAGNOSIS — H52203 Unspecified astigmatism, bilateral: Secondary | ICD-10-CM | POA: Diagnosis not present

## 2022-08-26 DIAGNOSIS — H04123 Dry eye syndrome of bilateral lacrimal glands: Secondary | ICD-10-CM | POA: Diagnosis not present

## 2022-08-26 DIAGNOSIS — H25013 Cortical age-related cataract, bilateral: Secondary | ICD-10-CM | POA: Diagnosis not present

## 2022-08-26 DIAGNOSIS — H2513 Age-related nuclear cataract, bilateral: Secondary | ICD-10-CM | POA: Diagnosis not present

## 2022-09-07 DIAGNOSIS — M810 Age-related osteoporosis without current pathological fracture: Secondary | ICD-10-CM | POA: Diagnosis not present

## 2022-09-30 ENCOUNTER — Ambulatory Visit: Payer: Medicare PPO

## 2022-09-30 VITALS — Ht 65.5 in | Wt 155.0 lb

## 2022-09-30 DIAGNOSIS — Z0000 Encounter for general adult medical examination without abnormal findings: Secondary | ICD-10-CM

## 2022-09-30 NOTE — Progress Notes (Signed)
Subjective:   Mindy Juarez is a 67 y.o. female who presents for an Initial Medicare Annual Wellness Visit.  Visit Complete: Virtual  I connected with  Mindy Juarez on 09/30/22 by a audio enabled telemedicine application and verified that I am speaking with the correct person using two identifiers.  Patient Location: Home  Provider Location: Office/Clinic  I discussed the limitations of evaluation and management by telemedicine. The patient expressed understanding and agreed to proceed.  Vital Signs: Because this visit was a virtual/telehealth visit, some criteria may be missing or patient reported. Any vitals not documented were not able to be obtained and vitals that have been documented are patient reported.   Review of Systems    Cardiac Risk Factors include: advanced age (>56men, >56 women);hypertension;Other (see comment), Risk factor comments: Osteoporosis     Objective:    Today's Vitals   09/30/22 1119  Weight: 155 lb (70.3 kg)  Height: 5' 5.5" (1.664 m)   Body mass index is 25.4 kg/m.     09/30/2022   11:26 AM 07/14/2017   11:47 AM 02/27/2017    1:35 PM 05/19/2016    2:23 PM 04/05/2016    2:19 PM 06/22/2015    3:35 PM 03/06/2014    8:34 AM  Advanced Directives  Does Patient Have a Medical Advance Directive? Yes Yes No Yes Yes No No  Type of Estate agent of [place];Living will Healthcare Power of [employer] of [place];Living will Healthcare Power of [place];Living will    Copy of Healthcare Power of Attorney in Chart? No - copy requested        Would patient like information on creating a medical advance directive?   No - Patient declined   No - patient declined information No - patient declined information    Current Medications (verified) Outpatient Encounter Medications as of 09/30/2022  Medication Sig   amLODipine (NORVASC) 5 MG tablet TAKE 1 TABLET BY MOUTH EVERY DAY   Calcium Citrate 250 MG TABS Take 1 tablet by  mouth daily.    cholecalciferol (VITAMIN D) 1000 UNITS tablet Take 5,000 Units by mouth 3 (three) times a week.    glucosamine-chondroitin 500-400 MG tablet Take 3 tablets by mouth daily. 1500/1200   Misc Natural Products (TART CHERRY ADVANCED PO) Take 800 mg by mouth daily.    Multiple Vitamins-Minerals (MULTIVITAMIN WITH MINERALS) tablet Take 1 tablet by mouth daily.   Omega-3 Fatty Acids (FISH OIL PO) Take 448 mg by mouth daily.   Romosozumab-aqqg (EVENITY) 105 MG/1. SOSY injection Inject 210 mg into the skin once.   No facility-administered encounter medications on file as of 09/30/2022.    Allergies (verified) Tramadol and Codeine   History: Past Medical History:  Diagnosis Date   Elevated liver enzymes 2018   History of echocardiogram    Echo 10/18: EF 60-65, normal wall motion, normal diastolic function, mild LAE   Osteoarthritis    +ANA; followed by Rheumatology (Deveshwar)   Osteoporosis 05/16/2016   Vitamin D deficiency    Past Surgical History:  Procedure Laterality Date   CARPAL TUNNEL RELEASE Right 2009   CESAREAN SECTION  1987, 1991   HIP ARTHROSCOPY W/ LABRAL REPAIR Left 2007   TRIGGER FINGER RELEASE     thumb   Family History  Problem Relation Age of Onset   Osteoporosis Mother    Hypertension Mother    Hyperlipidemia Mother    Hypertrophic cardiomyopathy Mother 66   Macular degeneration Mother  Prostate cancer Father 45       no surgery   Stroke Father        died from stroke   Diabetes Maternal Aunt    Diabetes Maternal Grandmother    Heart failure Maternal Grandfather    Stroke Paternal Grandfather    Sudden Cardiac Death Neg Hx    Social History   Socioeconomic History   Marital status: Married    Spouse name: Fayrene Fearing   Number of children: 2   Years of education: Not on file   Highest education level: Not on file  Occupational History   Occupation: Geneticist, molecular   Occupation: Retired  Tobacco Use   Smoking status: Former    Smokeless tobacco: Never   Tobacco comments:    high Air traffic controller   Vaping status: Never Used  Substance and Sexual Activity   Alcohol use: Yes    Alcohol/week: 4.0 standard drinks of alcohol    Types: 4 Glasses of wine per week   Drug use: No   Sexual activity: Not on file  Other Topics Concern   Not on file  Social History Narrative   [employer] - Geneticist, molecular with [employer]; School in [place]   Special ed teacher for 20 years   Married, 2 sons   Social Determinants of Health   Financial Resource Strain: Low Risk  (09/30/2022)   Overall Financial Resource Strain (CARDIA)    Difficulty of Paying Living Expenses: Not hard at all  Food Insecurity: No Food Insecurity (09/30/2022)   Hunger Vital Sign    Worried About Running Out of Food in the Last Year: Never true    Ran Out of Food in the Last Year: Never true  Transportation Needs: No Transportation Needs (09/30/2022)   PRAPARE - Administrator, Civil Service (Medical): No    Lack of Transportation (Non-Medical): No  Physical Activity: Sufficiently Active (09/30/2022)   Exercise Vital Sign    Days of Exercise per Week: 6 days    Minutes of Exercise per Session: 60 min  Stress: No Stress Concern Present (09/30/2022)   [employer] of Occupational Health - Occupational Stress Questionnaire    Feeling of Stress : Not at all  Social Connections: Socially Integrated (09/30/2022)   Social Connection and Isolation Panel [NHANES]    Frequency of Communication with Friends and Family: More than three times a week    Frequency of Social Gatherings with Friends and Family: More than three times a week    Attends Religious Services: More than 4 times per year    Active Member of [employer] or Organizations: Yes    Attends Engineer, structural: More than 4 times per year    Marital Status: Married    Tobacco Counseling Counseling given: Not Answered Tobacco comments: high school/college     Clinical Intake:  Pre-visit preparation completed: Yes  Pain : No/denies pain     BMI - recorded: 25.4 Nutritional Status: BMI 25 -29 Overweight Nutritional Risks: None Diabetes: No  How often do you need to have someone help you when you read instructions, pamphlets, or other written materials from your doctor or pharmacy?: 1 - Never     Information entered by :: Maree Ainley, RMA   Activities of Daily Living    09/30/2022   11:21 AM  In your present state of health, do you have any difficulty performing the following activities:  Hearing? 0  Vision? 0  Difficulty  concentrating or making decisions? 0  Walking or climbing stairs? 0  Dressing or bathing? 0  Doing errands, shopping? 0  Preparing Food and eating ? N  Using the Toilet? N  In the past six months, have you accidently leaked urine? N  Do you have problems with loss of bowel control? N  Managing your Medications? N  Managing your Finances? N  Housekeeping or managing your Housekeeping? N    Patient Care Team: Pincus Sanes, MD as PCP - General (Internal Medicine) Linna Darner, RD as Dietitian (Family Medicine) Janet Berlin, MD as Consulting Physician (Ophthalmology) Maeola Sarah, MD as Referring Physician (Ophthalmology)  Indicate any recent Medical Services you may have received from other than [facility] providers in the past year (date may be approximate).     Assessment:   This is a routine wellness examination for [place].  Hearing/Vision screen Hearing Screening - Comments:: Denies hearing difficulties   Vision Screening - Comments:: Wears eyeglasses  Dietary issues and exercise activities discussed:     Goals Addressed               This Visit's Progress     Patient Stated (pt-stated)        Would like to drop a few lbs.      Depression Screen    09/30/2022   11:32 AM 04/07/2022   11:02 AM 10/05/2021    1:02 PM 05/19/2016    2:22 PM 04/05/2016    2:18 PM 06/22/2015    3:35  PM 03/06/2014    8:34 AM  PHQ 2/9 Scores  PHQ - 2 Score 0 0 0 0 0 0 0  PHQ- 9 Score 1          Fall Risk    09/30/2022   11:27 AM 04/07/2022   11:02 AM 10/05/2021    1:02 PM 05/19/2016    2:22 PM 04/05/2016    2:18 PM  Fall Risk   Falls in the past year? 0 0 0 No No  Number falls in past yr: 0 0 0    Injury with Fall? 0 0 0    Risk for fall due to : No Fall Risks No Fall Risks No Fall Risks    Follow up Falls prevention discussed;Falls evaluation completed Falls evaluation completed Falls evaluation completed      MEDICARE RISK AT HOME: Medicare Risk at Home Any stairs in or around the home?: Yes (in the front) If so, are there any without handrails?: Yes Home free of loose throw rugs in walkways, pet beds, electrical cords, etc?: Yes Adequate lighting in your home to reduce risk of falls?: Yes Life alert?: No Use of a cane, walker or w/c?: No Grab bars in the bathroom?: Yes Shower chair or bench in shower?: Yes Elevated toilet seat or a handicapped toilet?: Yes  TIMED UP AND GO:  Was the test performed? No    Cognitive Function:        09/30/2022   11:28 AM  6CIT Screen  What Year? 0 points  What month? 0 points  What time? 0 points  Count back from 20 0 points  Months in reverse 0 points  Repeat phrase 0 points  Total Score 0 points    Immunizations Immunization History  Administered Date(s) Administered   Fluad Quad(high Dose 65+) 11/03/2021   Influenza-Unspecified 11/14/2016   PNEUMOCOCCAL CONJUGATE-20 07/22/2021   Pfizer Covid-19 Vaccine Bivalent Booster 61yrs & up 11/03/2021   Tdap 12/09/2006  Zoster Recombinant(Shingrix) 06/19/2019, 10/24/2019    TDAP status: Up to date  Flu Vaccine status: Due, Education has been provided regarding the importance of this vaccine. Advised may receive this vaccine at local pharmacy or Health Dept. Aware to provide a copy of the vaccination record if obtained from local pharmacy or Health Dept. Verbalized acceptance  and understanding.  Pneumococcal vaccine status: Up to date  Covid-19 vaccine status: Information provided on how to obtain vaccines.   Qualifies for Shingles Vaccine? Yes   Zostavax completed Yes   Shingrix Completed?: Yes  Screening Tests Health Maintenance  Topic Date Due   DTaP/Tdap/Td (2 - Td or Tdap) 12/08/2016   Colonoscopy  07/20/2017   COVID-19 Vaccine (2 - Pfizer risk series) 11/24/2021   INFLUENZA VACCINE  09/08/2022   MAMMOGRAM  04/19/2023   Medicare Annual Wellness (AWV)  09/30/2023   Pneumonia Vaccine 86+ Years old  Completed   DEXA SCAN  Completed   Hepatitis C Screening  Completed   Zoster Vaccines- Shingrix  Completed   HPV VACCINES  Aged Out    Health Maintenance  Health Maintenance Due  Topic Date Due   DTaP/Tdap/Td (2 - Td or Tdap) 12/08/2016   Colonoscopy  07/20/2017   COVID-19 Vaccine (2 - Pfizer risk series) 11/24/2021   INFLUENZA VACCINE  09/08/2022    Colorectal cancer screening: Type of screening: Colonoscopy. Completed 09/18/2017. Repeat every 10 years  Mammogram status: Completed 04/19/2022. Repeat every year  Bone Density status: Completed 08/03/2021. Results reflect: Bone density results: OSTEOPOROSIS. Repeat every 2 years.  Lung Cancer Screening: (Low Dose CT Chest recommended if Age 18-80 years, 20 pack-year currently smoking OR have quit w/in 15years.) does not qualify.   Lung Cancer Screening Referral: N/A  Additional Screening:  Hepatitis C Screening: does qualify; Completed 06/10/2016  Vision Screening: Recommended annual ophthalmology exams for early detection of glaucoma and other disorders of the eye. Is the patient up to date with their annual eye exam?  Yes  Who is the provider or what is the name of the office in which the patient attends annual eye exams? Dr. Burgess Estelle If pt is not established with a provider, would they like to be referred to a provider to establish care? No .   Dental Screening: Recommended annual dental  exams for proper oral hygiene   Community Resource Referral / Chronic Care Management: CRR required this visit?  No   CCM required this visit?  No     Plan:     I have personally reviewed and noted the following in the patient's chart:   Medical and social history Use of alcohol, tobacco or illicit drugs  Current medications and supplements including opioid prescriptions. Patient is not currently taking opioid prescriptions. Functional ability and status Nutritional status Physical activity Advanced directives List of other physicians Hospitalizations, surgeries, and ER visits in previous 12 months Vitals Screenings to include cognitive, depression, and falls Referrals and appointments  In addition, I have reviewed and discussed with patient certain preventive protocols, quality metrics, and best practice recommendations. A written personalized care plan for preventive services as well as general preventive health recommendations were provided to patient.     Chennel Olivos L Zen Cedillos, CMA   09/30/2022   After Visit Summary: (MyChart) Due to this being a telephonic visit, the after visit summary with patients personalized plan was offered to patient via MyChart   Nurse Notes: Patient will soon be due for a Flu and Covid vaccine.  She has an up  coming appointment for a Colonoscopy on 10/31/2022.  Patient had no other concerns to address today.

## 2022-09-30 NOTE — Patient Instructions (Signed)
Mindy Juarez , Thank you for taking time to come for your Medicare Wellness Visit. I appreciate your ongoing commitment to your health goals. Please review the following plan we discussed and let me know if I can assist you in the future.   Referrals/Orders/Follow-Ups/Clinician Recommendations: You will soon be due for a Flu and Covid vaccine.  You can get these done at your pharmacy.  It was nice talking with you today and keep up the good work.    This is a list of the screening recommended for you and due dates:  Health Maintenance  Topic Date Due   DTaP/Tdap/Td vaccine (2 - Td or Tdap) 12/08/2016   Colon Cancer Screening  07/20/2017   COVID-19 Vaccine (2 - Pfizer risk series) 11/24/2021   Flu Shot  09/08/2022   Mammogram  04/19/2023   Medicare Annual Wellness Visit  09/30/2023   Pneumonia Vaccine  Completed   DEXA scan (bone density measurement)  Completed   Hepatitis C Screening  Completed   Zoster (Shingles) Vaccine  Completed   HPV Vaccine  Aged Out    Advanced directives: (Copy Requested) Please bring a copy of your health care power of attorney and living will to the office to be added to your chart at your convenience.  Next Medicare Annual Wellness Visit scheduled for next year: Yes

## 2022-10-11 DIAGNOSIS — Z5181 Encounter for therapeutic drug level monitoring: Secondary | ICD-10-CM | POA: Diagnosis not present

## 2022-10-11 DIAGNOSIS — M810 Age-related osteoporosis without current pathological fracture: Secondary | ICD-10-CM | POA: Diagnosis not present

## 2022-10-25 ENCOUNTER — Ambulatory Visit: Payer: Medicare PPO | Admitting: Sports Medicine

## 2022-10-25 VITALS — BP 128/76 | Ht 65.5 in | Wt 155.0 lb

## 2022-10-25 DIAGNOSIS — M25552 Pain in left hip: Secondary | ICD-10-CM | POA: Diagnosis not present

## 2022-10-25 DIAGNOSIS — M1711 Unilateral primary osteoarthritis, right knee: Secondary | ICD-10-CM

## 2022-10-25 NOTE — Progress Notes (Signed)
Subjective:    Patient ID: Mindy Juarez, female    DOB: Sep 13, 1955, 67 y.o.   MRN: [id number]  HPI chief complaint: Right knee pain and left hip pain  Mindy Juarez presents today with returning right knee pain.  She has a well-documented history of osteoarthritis in this knee.  It is beginning to affect her quality of life.  She finds it difficult to be active.  Her range of motion is decreasing.  Some days are better than others.  She does use topical Voltaren with some benefit.  Her left hip pain is along the lateral hip.  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  No recent trauma.    Review of Systems As above    Objective:   Physical Exam  Well-developed, well-nourished.  No acute distress  Left hip: Smooth painless hip range of motion with a negative logroll.  There is tenderness to palpation at the posterior greater trochanter.  Right knee: Range of motion 0 to about 100 degrees.  Trace effusion.  No tenderness to palpation.  Good stability.  Negative McMurray's.      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  A physical therapist has already been recommended to [place] so I provided her with specific diagnoses which I would like for him to treat.  She will continue with her topical Voltaren and may use it as needed.  I have also recommended a trial of capsaicin.  We discussed a possible cortisone injection in the right knee if symptoms do not improve.  She will see me in our [facility] office if that needs to be done.  Follow-up as needed.  This note was dictated using Dragon naturally speaking software and may contain errors in syntax, spelling, or content which have not been identified prior to signing this note.

## 2022-10-25 NOTE — Patient Instructions (Signed)
-  right knee osteoarthritis -left hip gluteus medius/minimus tendinopathy

## 2022-10-31 DIAGNOSIS — D123 Benign neoplasm of transverse colon: Secondary | ICD-10-CM | POA: Diagnosis not present

## 2022-10-31 DIAGNOSIS — Z86010 Personal history of colonic polyps: Secondary | ICD-10-CM | POA: Diagnosis not present

## 2022-10-31 DIAGNOSIS — K648 Other hemorrhoids: Secondary | ICD-10-CM | POA: Diagnosis not present

## 2022-10-31 DIAGNOSIS — Z09 Encounter for follow-up examination after completed treatment for conditions other than malignant neoplasm: Secondary | ICD-10-CM | POA: Diagnosis not present

## 2022-10-31 DIAGNOSIS — K5730 Diverticulosis of large intestine without perforation or abscess without bleeding: Secondary | ICD-10-CM | POA: Diagnosis not present

## 2022-10-31 LAB — HM COLONOSCOPY

## 2022-11-02 DIAGNOSIS — M25661 Stiffness of right knee, not elsewhere classified: Secondary | ICD-10-CM | POA: Diagnosis not present

## 2022-11-02 DIAGNOSIS — R293 Abnormal posture: Secondary | ICD-10-CM | POA: Diagnosis not present

## 2022-11-02 DIAGNOSIS — M25561 Pain in right knee: Secondary | ICD-10-CM | POA: Diagnosis not present

## 2022-11-02 DIAGNOSIS — R262 Difficulty in walking, not elsewhere classified: Secondary | ICD-10-CM | POA: Diagnosis not present

## 2022-11-02 DIAGNOSIS — D123 Benign neoplasm of transverse colon: Secondary | ICD-10-CM | POA: Diagnosis not present

## 2022-11-04 DIAGNOSIS — R293 Abnormal posture: Secondary | ICD-10-CM | POA: Diagnosis not present

## 2022-11-04 DIAGNOSIS — R262 Difficulty in walking, not elsewhere classified: Secondary | ICD-10-CM | POA: Diagnosis not present

## 2022-11-04 DIAGNOSIS — M25661 Stiffness of right knee, not elsewhere classified: Secondary | ICD-10-CM | POA: Diagnosis not present

## 2022-11-04 DIAGNOSIS — M25561 Pain in right knee: Secondary | ICD-10-CM | POA: Diagnosis not present

## 2022-11-10 DIAGNOSIS — M25561 Pain in right knee: Secondary | ICD-10-CM | POA: Diagnosis not present

## 2022-11-10 DIAGNOSIS — R293 Abnormal posture: Secondary | ICD-10-CM | POA: Diagnosis not present

## 2022-11-10 DIAGNOSIS — R262 Difficulty in walking, not elsewhere classified: Secondary | ICD-10-CM | POA: Diagnosis not present

## 2022-11-10 DIAGNOSIS — M25661 Stiffness of right knee, not elsewhere classified: Secondary | ICD-10-CM | POA: Diagnosis not present

## 2022-11-15 DIAGNOSIS — M25661 Stiffness of right knee, not elsewhere classified: Secondary | ICD-10-CM | POA: Diagnosis not present

## 2022-11-15 DIAGNOSIS — R293 Abnormal posture: Secondary | ICD-10-CM | POA: Diagnosis not present

## 2022-11-15 DIAGNOSIS — M25561 Pain in right knee: Secondary | ICD-10-CM | POA: Diagnosis not present

## 2022-11-15 DIAGNOSIS — R262 Difficulty in walking, not elsewhere classified: Secondary | ICD-10-CM | POA: Diagnosis not present

## 2022-11-18 DIAGNOSIS — M25561 Pain in right knee: Secondary | ICD-10-CM | POA: Diagnosis not present

## 2022-11-18 DIAGNOSIS — R262 Difficulty in walking, not elsewhere classified: Secondary | ICD-10-CM | POA: Diagnosis not present

## 2022-11-18 DIAGNOSIS — M25661 Stiffness of right knee, not elsewhere classified: Secondary | ICD-10-CM | POA: Diagnosis not present

## 2022-11-18 DIAGNOSIS — R293 Abnormal posture: Secondary | ICD-10-CM | POA: Diagnosis not present

## 2022-11-23 DIAGNOSIS — R293 Abnormal posture: Secondary | ICD-10-CM | POA: Diagnosis not present

## 2022-11-23 DIAGNOSIS — M8588 Other specified disorders of bone density and structure, other site: Secondary | ICD-10-CM | POA: Diagnosis not present

## 2022-11-23 DIAGNOSIS — M25661 Stiffness of right knee, not elsewhere classified: Secondary | ICD-10-CM | POA: Diagnosis not present

## 2022-11-23 DIAGNOSIS — Z8262 Family history of osteoporosis: Secondary | ICD-10-CM | POA: Diagnosis not present

## 2022-11-23 DIAGNOSIS — R262 Difficulty in walking, not elsewhere classified: Secondary | ICD-10-CM | POA: Diagnosis not present

## 2022-11-23 DIAGNOSIS — M25561 Pain in right knee: Secondary | ICD-10-CM | POA: Diagnosis not present

## 2022-11-24 DIAGNOSIS — M25561 Pain in right knee: Secondary | ICD-10-CM | POA: Diagnosis not present

## 2022-11-24 DIAGNOSIS — R262 Difficulty in walking, not elsewhere classified: Secondary | ICD-10-CM | POA: Diagnosis not present

## 2022-11-24 DIAGNOSIS — M25661 Stiffness of right knee, not elsewhere classified: Secondary | ICD-10-CM | POA: Diagnosis not present

## 2022-11-24 DIAGNOSIS — R293 Abnormal posture: Secondary | ICD-10-CM | POA: Diagnosis not present

## 2022-11-28 DIAGNOSIS — M25661 Stiffness of right knee, not elsewhere classified: Secondary | ICD-10-CM | POA: Diagnosis not present

## 2022-11-28 DIAGNOSIS — R262 Difficulty in walking, not elsewhere classified: Secondary | ICD-10-CM | POA: Diagnosis not present

## 2022-11-28 DIAGNOSIS — M25561 Pain in right knee: Secondary | ICD-10-CM | POA: Diagnosis not present

## 2022-11-28 DIAGNOSIS — R293 Abnormal posture: Secondary | ICD-10-CM | POA: Diagnosis not present

## 2022-11-29 DIAGNOSIS — Z5181 Encounter for therapeutic drug level monitoring: Secondary | ICD-10-CM | POA: Diagnosis not present

## 2022-11-29 DIAGNOSIS — M810 Age-related osteoporosis without current pathological fracture: Secondary | ICD-10-CM | POA: Diagnosis not present

## 2022-11-29 DIAGNOSIS — Z8262 Family history of osteoporosis: Secondary | ICD-10-CM | POA: Diagnosis not present

## 2022-11-30 ENCOUNTER — Other Ambulatory Visit: Payer: Self-pay | Admitting: Internal Medicine

## 2022-11-30 DIAGNOSIS — R293 Abnormal posture: Secondary | ICD-10-CM | POA: Diagnosis not present

## 2022-11-30 DIAGNOSIS — R262 Difficulty in walking, not elsewhere classified: Secondary | ICD-10-CM | POA: Diagnosis not present

## 2022-11-30 DIAGNOSIS — M25661 Stiffness of right knee, not elsewhere classified: Secondary | ICD-10-CM | POA: Diagnosis not present

## 2022-11-30 DIAGNOSIS — M25561 Pain in right knee: Secondary | ICD-10-CM | POA: Diagnosis not present

## 2022-11-30 DIAGNOSIS — Z5181 Encounter for therapeutic drug level monitoring: Secondary | ICD-10-CM | POA: Diagnosis not present

## 2022-11-30 DIAGNOSIS — M810 Age-related osteoporosis without current pathological fracture: Secondary | ICD-10-CM | POA: Diagnosis not present

## 2022-12-05 DIAGNOSIS — M25661 Stiffness of right knee, not elsewhere classified: Secondary | ICD-10-CM | POA: Diagnosis not present

## 2022-12-05 DIAGNOSIS — R262 Difficulty in walking, not elsewhere classified: Secondary | ICD-10-CM | POA: Diagnosis not present

## 2022-12-05 DIAGNOSIS — R293 Abnormal posture: Secondary | ICD-10-CM | POA: Diagnosis not present

## 2022-12-05 DIAGNOSIS — M25561 Pain in right knee: Secondary | ICD-10-CM | POA: Diagnosis not present

## 2022-12-26 DIAGNOSIS — R262 Difficulty in walking, not elsewhere classified: Secondary | ICD-10-CM | POA: Diagnosis not present

## 2022-12-26 DIAGNOSIS — M25661 Stiffness of right knee, not elsewhere classified: Secondary | ICD-10-CM | POA: Diagnosis not present

## 2022-12-26 DIAGNOSIS — R293 Abnormal posture: Secondary | ICD-10-CM | POA: Diagnosis not present

## 2022-12-26 DIAGNOSIS — M25561 Pain in right knee: Secondary | ICD-10-CM | POA: Diagnosis not present

## 2022-12-29 DIAGNOSIS — R262 Difficulty in walking, not elsewhere classified: Secondary | ICD-10-CM | POA: Diagnosis not present

## 2022-12-29 DIAGNOSIS — R293 Abnormal posture: Secondary | ICD-10-CM | POA: Diagnosis not present

## 2022-12-29 DIAGNOSIS — M25561 Pain in right knee: Secondary | ICD-10-CM | POA: Diagnosis not present

## 2022-12-29 DIAGNOSIS — M25661 Stiffness of right knee, not elsewhere classified: Secondary | ICD-10-CM | POA: Diagnosis not present

## 2023-01-02 DIAGNOSIS — M25561 Pain in right knee: Secondary | ICD-10-CM | POA: Diagnosis not present

## 2023-01-02 DIAGNOSIS — M25661 Stiffness of right knee, not elsewhere classified: Secondary | ICD-10-CM | POA: Diagnosis not present

## 2023-01-02 DIAGNOSIS — R293 Abnormal posture: Secondary | ICD-10-CM | POA: Diagnosis not present

## 2023-01-02 DIAGNOSIS — R262 Difficulty in walking, not elsewhere classified: Secondary | ICD-10-CM | POA: Diagnosis not present

## 2023-01-03 DIAGNOSIS — M25661 Stiffness of right knee, not elsewhere classified: Secondary | ICD-10-CM | POA: Diagnosis not present

## 2023-01-03 DIAGNOSIS — R293 Abnormal posture: Secondary | ICD-10-CM | POA: Diagnosis not present

## 2023-01-03 DIAGNOSIS — M25561 Pain in right knee: Secondary | ICD-10-CM | POA: Diagnosis not present

## 2023-01-03 DIAGNOSIS — R262 Difficulty in walking, not elsewhere classified: Secondary | ICD-10-CM | POA: Diagnosis not present

## 2023-01-09 DIAGNOSIS — M25561 Pain in right knee: Secondary | ICD-10-CM | POA: Diagnosis not present

## 2023-01-09 DIAGNOSIS — R262 Difficulty in walking, not elsewhere classified: Secondary | ICD-10-CM | POA: Diagnosis not present

## 2023-01-09 DIAGNOSIS — R293 Abnormal posture: Secondary | ICD-10-CM | POA: Diagnosis not present

## 2023-01-09 DIAGNOSIS — M25661 Stiffness of right knee, not elsewhere classified: Secondary | ICD-10-CM | POA: Diagnosis not present

## 2023-01-17 DIAGNOSIS — M25561 Pain in right knee: Secondary | ICD-10-CM | POA: Diagnosis not present

## 2023-01-17 DIAGNOSIS — R262 Difficulty in walking, not elsewhere classified: Secondary | ICD-10-CM | POA: Diagnosis not present

## 2023-01-17 DIAGNOSIS — M25661 Stiffness of right knee, not elsewhere classified: Secondary | ICD-10-CM | POA: Diagnosis not present

## 2023-01-17 DIAGNOSIS — R293 Abnormal posture: Secondary | ICD-10-CM | POA: Diagnosis not present

## 2023-01-24 DIAGNOSIS — Z85828 Personal history of other malignant neoplasm of skin: Secondary | ICD-10-CM | POA: Diagnosis not present

## 2023-01-24 DIAGNOSIS — R262 Difficulty in walking, not elsewhere classified: Secondary | ICD-10-CM | POA: Diagnosis not present

## 2023-01-24 DIAGNOSIS — M25561 Pain in right knee: Secondary | ICD-10-CM | POA: Diagnosis not present

## 2023-01-24 DIAGNOSIS — L578 Other skin changes due to chronic exposure to nonionizing radiation: Secondary | ICD-10-CM | POA: Diagnosis not present

## 2023-01-24 DIAGNOSIS — D229 Melanocytic nevi, unspecified: Secondary | ICD-10-CM | POA: Diagnosis not present

## 2023-01-24 DIAGNOSIS — L820 Inflamed seborrheic keratosis: Secondary | ICD-10-CM | POA: Diagnosis not present

## 2023-01-24 DIAGNOSIS — L821 Other seborrheic keratosis: Secondary | ICD-10-CM | POA: Diagnosis not present

## 2023-01-24 DIAGNOSIS — R293 Abnormal posture: Secondary | ICD-10-CM | POA: Diagnosis not present

## 2023-01-24 DIAGNOSIS — L814 Other melanin hyperpigmentation: Secondary | ICD-10-CM | POA: Diagnosis not present

## 2023-01-24 DIAGNOSIS — M25661 Stiffness of right knee, not elsewhere classified: Secondary | ICD-10-CM | POA: Diagnosis not present

## 2023-01-26 DIAGNOSIS — R293 Abnormal posture: Secondary | ICD-10-CM | POA: Diagnosis not present

## 2023-01-26 DIAGNOSIS — M25661 Stiffness of right knee, not elsewhere classified: Secondary | ICD-10-CM | POA: Diagnosis not present

## 2023-01-26 DIAGNOSIS — R262 Difficulty in walking, not elsewhere classified: Secondary | ICD-10-CM | POA: Diagnosis not present

## 2023-01-26 DIAGNOSIS — M25561 Pain in right knee: Secondary | ICD-10-CM | POA: Diagnosis not present

## 2023-02-06 DIAGNOSIS — R293 Abnormal posture: Secondary | ICD-10-CM | POA: Diagnosis not present

## 2023-02-06 DIAGNOSIS — R262 Difficulty in walking, not elsewhere classified: Secondary | ICD-10-CM | POA: Diagnosis not present

## 2023-02-06 DIAGNOSIS — M25561 Pain in right knee: Secondary | ICD-10-CM | POA: Diagnosis not present

## 2023-02-06 DIAGNOSIS — M25661 Stiffness of right knee, not elsewhere classified: Secondary | ICD-10-CM | POA: Diagnosis not present

## 2023-03-07 DIAGNOSIS — R262 Difficulty in walking, not elsewhere classified: Secondary | ICD-10-CM | POA: Diagnosis not present

## 2023-03-07 DIAGNOSIS — M25561 Pain in right knee: Secondary | ICD-10-CM | POA: Diagnosis not present

## 2023-03-07 DIAGNOSIS — R293 Abnormal posture: Secondary | ICD-10-CM | POA: Diagnosis not present

## 2023-03-07 DIAGNOSIS — M25661 Stiffness of right knee, not elsewhere classified: Secondary | ICD-10-CM | POA: Diagnosis not present

## 2023-04-09 ENCOUNTER — Encounter: Payer: Self-pay | Admitting: Internal Medicine

## 2023-04-09 NOTE — Progress Notes (Unsigned)
 Subjective:    Patient ID: Mindy Juarez, female    DOB: 01/05/1956, 68 y.o.   MRN: [id number]      HPI Mindy Juarez is here for a Physical exam and her chronic medical problems.   BP has been well controlled at home.   Has had some balance issues.  Has fallen over the past year.     Medications and allergies reviewed with patient and updated if appropriate.  Current Outpatient Medications on File Prior to Visit  Medication Sig Dispense Refill   amLODipine (NORVASC) 5 MG tablet TAKE 1 TABLET BY MOUTH EVERY DAY 90 tablet 2   Calcium Citrate 250 MG TABS Take 1 tablet by mouth daily.      cholecalciferol (VITAMIN D) 1000 UNITS tablet Take 5,000 Units by mouth 3 (three) times a week.      glucosamine-chondroitin 500-400 MG tablet Take 3 tablets by mouth daily. 1500/1200     Misc Natural Products (TART CHERRY ADVANCED PO) Take 800 mg by mouth daily.      Multiple Vitamins-Minerals (MULTIVITAMIN WITH MINERALS) tablet Take 1 tablet by mouth daily.     Omega-3 Fatty Acids (FISH OIL PO) Take 448 mg by mouth daily.     Romosozumab-aqqg (EVENITY) 105 MG/1. SOSY injection Inject 210 mg into the skin once.     No current facility-administered medications on file prior to visit.    Review of Systems  Constitutional:  Negative for fever.  Eyes:  Negative for visual disturbance.  Respiratory:  Negative for cough, shortness of breath and wheezing.   Cardiovascular:  Negative for chest pain, palpitations and leg swelling.  Gastrointestinal:  Negative for abdominal pain, blood in stool, constipation and diarrhea.       No gerd  Genitourinary:  Negative for dysuria.  Musculoskeletal:  Positive for arthralgias (diffuse). Negative for back pain.  Skin:  Negative for rash.  Neurological:  Positive for dizziness (occ). Negative for light-headedness and headaches.  Psychiatric/Behavioral:  Negative for dysphoric mood. The patient is not nervous/anxious.        Objective:   Vitals:    04/10/23 1301  BP: 132/66  Pulse: 70  Temp: 98.6 F (37 C)  SpO2: 97%   Filed Weights   04/10/23 1301  Weight: 155 lb 6 oz (70.5 kg)   Body mass index is 25.46 kg/m.  BP Readings from Last 3 Encounters:  04/10/23 132/66  10/25/22 128/76  04/07/22 130/78    Wt Readings from Last 3 Encounters:  04/10/23 155 lb 6 oz (70.5 kg)  10/25/22 155 lb (70.3 kg)  09/30/22 155 lb (70.3 kg)       Physical Exam Constitutional: She appears well-developed and well-nourished. No distress.  HENT:  Head: Normocephalic and atraumatic.  Right Ear: External ear normal. Normal ear canal and TM Left Ear: External ear normal.  Normal ear canal and TM Mouth/Throat: Oropharynx is clear and moist.  Eyes: Conjunctivae normal.  Neck: Neck supple. No tracheal deviation present. No thyromegaly present.  No carotid bruit  Cardiovascular: Normal rate, regular rhythm and normal heart sounds.   2/6 systolic murmur heard.  No edema. Pulmonary/Chest: Effort normal and breath sounds normal. No respiratory distress. She has no wheezes. She has no rales.  Breast: deferred   Abdominal: Soft. She exhibits no distension. There is no tenderness.  Lymphadenopathy: She has no cervical adenopathy.  Skin: Skin is warm and dry. She is not diaphoretic.  Psychiatric: She has a normal mood and affect. Her behavior  is normal.     Lab Results  Component Value Date   WBC 5.0 09/13/2016   HGB 13.1 09/13/2016   HCT 39.5 09/13/2016   PLT 224 09/13/2016   GLUCOSE 93 09/13/2016   CHOL 159 05/29/2015   TRIG 35 05/29/2015   HDL 91 05/29/2015   LDLCALC 61 05/29/2015   ALT 42 (H) 09/13/2016   AST 35 09/13/2016   NA 140 09/13/2016   K 4.3 09/13/2016   CL 104 09/13/2016   CREATININE 0.64 09/13/2016   BUN 14 09/13/2016   CO2 24 09/13/2016   TSH 2.54 09/13/2016   HGBA1C 5.5 06/10/2016         Assessment & Plan:   Physical exam: Screening blood work  ordered Exercise  regular - walking daily, yoga once a  week, weights 2/week, occasional biking, hiking  Weight  normal  Substance abuse  none   Reviewed recommended immunizations.   Health Maintenance  Topic Date Due   COVID-19 Vaccine (2 - Pfizer risk series) 04/26/2023 (Originally 11/24/2021)   DTaP/Tdap/Td (2 - Td or Tdap) 04/09/2024 (Originally 12/08/2016)   MAMMOGRAM  04/19/2023   Medicare Annual Wellness (AWV)  09/30/2023   Colonoscopy  10/30/2032   Pneumonia Vaccine 29+ Years old  Completed   INFLUENZA VACCINE  Completed   DEXA SCAN  Completed   Hepatitis C Screening  Completed   Zoster Vaccines- Shingrix  Completed   HPV VACCINES  Aged Out        See Problem List for Assessment and Plan of chronic medical problems.

## 2023-04-09 NOTE — Patient Instructions (Addendum)
 Blood work was ordered.       Medications changes include :   None   Lets consider an Echocardiogram of your heart next year to evaluate your heart murmur.  It would be done at an outpatient cardiology office.     Return in about 1 year (around 04/09/2024) for Physical Exam.   Health Maintenance, Female Adopting a healthy lifestyle and getting preventive care are important in promoting health and wellness. Ask your health care provider about: The right schedule for you to have regular tests and exams. Things you can do on your own to prevent diseases and keep yourself healthy. What should I know about diet, weight, and exercise? Eat a healthy diet  Eat a diet that includes plenty of vegetables, fruits, low-fat dairy products, and lean protein. Do not eat a lot of foods that are high in solid fats, added sugars, or sodium. Maintain a healthy weight Body mass index (BMI) is used to identify weight problems. It estimates body fat based on height and weight. Your health care provider can help determine your BMI and help you achieve or maintain a healthy weight. Get regular exercise Get regular exercise. This is one of the most important things you can do for your health. Most adults should: Exercise for at least 150 minutes each week. The exercise should increase your heart rate and make you sweat (moderate-intensity exercise). Do strengthening exercises at least twice a week. This is in addition to the moderate-intensity exercise. Spend less time sitting. Even light physical activity can be beneficial. Watch cholesterol and blood lipids Have your blood tested for lipids and cholesterol at 68 years of age, then have this test every 5 years. Have your cholesterol levels checked more often if: Your lipid or cholesterol levels are high. You are older than 67 years of age. You are at high risk for heart disease. What should I know about cancer screening? Depending on your health  history and family history, you may need to have cancer screening at various ages. This may include screening for: Breast cancer. Cervical cancer. Colorectal cancer. Skin cancer. Lung cancer. What should I know about heart disease, diabetes, and high blood pressure? Blood pressure and heart disease High blood pressure causes heart disease and increases the risk of stroke. This is more likely to develop in people who have high blood pressure readings or are overweight. Have your blood pressure checked: Every 3-5 years if you are 46-37 years of age. Every year if you are 57 years old or older. Diabetes Have regular diabetes screenings. This checks your fasting blood sugar level. Have the screening done: Once every three years after age 74 if you are at a normal weight and have a low risk for diabetes. More often and at a younger age if you are overweight or have a high risk for diabetes. What should I know about preventing infection? Hepatitis B If you have a higher risk for hepatitis B, you should be screened for this virus. Talk with your health care provider to find out if you are at risk for hepatitis B infection. Hepatitis C Testing is recommended for: Everyone born from [birth year] through 1965. Anyone with known risk factors for hepatitis C. Sexually transmitted infections (STIs) Get screened for STIs, including gonorrhea and chlamydia, if: You are sexually active and are younger than 68 years of age. You are older than 68 years of age and your health care provider tells you that you are at  risk for this type of infection. Your sexual activity has changed since you were last screened, and you are at increased risk for chlamydia or gonorrhea. Ask your health care provider if you are at risk. Ask your health care provider about whether you are at high risk for HIV. Your health care provider may recommend a prescription medicine to help prevent HIV infection. If you choose to take medicine to  prevent HIV, you should first get tested for HIV. You should then be tested every 3 months for as long as you are taking the medicine. Pregnancy If you are about to stop having your period (premenopausal) and you may become pregnant, seek counseling before you get pregnant. Take 400 to 800 micrograms (mcg) of folic acid every day if you become pregnant. Ask for birth control (contraception) if you want to prevent pregnancy. Osteoporosis and menopause Osteoporosis is a disease in which the bones lose minerals and strength with aging. This can result in bone fractures. If you are 31 years old or older, or if you are at risk for osteoporosis and fractures, ask your health care provider if you should: Be screened for bone loss. Take a calcium or vitamin D supplement to lower your risk of fractures. Be given hormone replacement therapy (HRT) to treat symptoms of menopause. Follow these instructions at home: Alcohol use Do not drink alcohol if: Your health care provider tells you not to drink. You are pregnant, may be pregnant, or are planning to become pregnant. If you drink alcohol: Limit how much you have to: 0-1 drink a day. Know how much alcohol is in your drink. In the [place] one drink equals one 12 oz bottle of beer (355 mL), one 5 oz glass of wine (148 mL), or one 1 oz glass of hard liquor (44 mL). Lifestyle Do not use any products that contain nicotine or tobacco. These products include cigarettes, chewing tobacco, and vaping devices, such as e-cigarettes. If you need help quitting, ask your health care provider. Do not use street drugs. Do not share needles. Ask your health care provider for help if you need support or information about quitting drugs. General instructions Schedule regular health, dental, and eye exams. Stay current with your vaccines. Tell your health care provider if: You often feel depressed. You have ever been abused or do not feel safe at  home. Summary Adopting a healthy lifestyle and getting preventive care are important in promoting health and wellness. Follow your health care provider's instructions about healthy diet, exercising, and getting tested or screened for diseases. Follow your health care provider's instructions on monitoring your cholesterol and blood pressure. This information is not intended to replace advice given to you by your health care provider. Make sure you discuss any questions you have with your health care provider. Document Revised: 06/15/2020 Document Reviewed: 06/15/2020 Elsevier Patient Education  2024 [employer].

## 2023-04-10 ENCOUNTER — Ambulatory Visit (INDEPENDENT_AMBULATORY_CARE_PROVIDER_SITE_OTHER): Payer: Medicare PPO | Admitting: Internal Medicine

## 2023-04-10 ENCOUNTER — Encounter: Payer: Self-pay | Admitting: Internal Medicine

## 2023-04-10 VITALS — BP 132/66 | HR 70 | Temp 98.6°F | Ht 65.5 in | Wt 155.4 lb

## 2023-04-10 DIAGNOSIS — R011 Cardiac murmur, unspecified: Secondary | ICD-10-CM | POA: Insufficient documentation

## 2023-04-10 DIAGNOSIS — Z0000 Encounter for general adult medical examination without abnormal findings: Secondary | ICD-10-CM | POA: Diagnosis not present

## 2023-04-10 DIAGNOSIS — I10 Essential (primary) hypertension: Secondary | ICD-10-CM | POA: Diagnosis not present

## 2023-04-10 DIAGNOSIS — M810 Age-related osteoporosis without current pathological fracture: Secondary | ICD-10-CM

## 2023-04-10 LAB — CBC WITH DIFFERENTIAL/PLATELET
Basophils Absolute: 0.1 10*3/uL (ref 0.0–0.1)
Basophils Relative: 1 % (ref 0.0–3.0)
Eosinophils Absolute: 0.2 10*3/uL (ref 0.0–0.7)
Eosinophils Relative: 3.3 % (ref 0.0–5.0)
HCT: 38 % (ref 36.0–46.0)
Hemoglobin: 12.6 g/dL (ref 12.0–15.0)
Lymphocytes Relative: 24.3 % (ref 12.0–46.0)
Lymphs Abs: 1.4 10*3/uL (ref 0.7–4.0)
MCHC: 33.2 g/dL (ref 30.0–36.0)
MCV: 92.4 fl (ref 78.0–100.0)
Monocytes Absolute: 0.3 10*3/uL (ref 0.1–1.0)
Monocytes Relative: 5.5 % (ref 3.0–12.0)
Neutro Abs: 3.7 10*3/uL (ref 1.4–7.7)
Neutrophils Relative %: 65.9 % (ref 43.0–77.0)
Platelets: 242 10*3/uL (ref 150.0–400.0)
RBC: 4.12 Mil/uL (ref 3.87–5.11)
RDW: 13.8 % (ref 11.5–15.5)
WBC: 5.6 10*3/uL (ref 4.0–10.5)

## 2023-04-10 LAB — LIPID PANEL
Cholesterol: 182 mg/dL (ref 0–200)
HDL: 79.1 mg/dL (ref >39.00)
LDL Cholesterol: 93 mg/dL (ref 0–99)
NonHDL: 103.31
Total CHOL/HDL Ratio: 2
Triglycerides: 53 mg/dL (ref 0.0–149.0)
VLDL: 10.6 mg/dL (ref 0.0–40.0)

## 2023-04-10 LAB — COMPREHENSIVE METABOLIC PANEL
ALT: 29 U/L (ref 0–35)
AST: 34 U/L (ref 0–37)
Albumin: 4.2 g/dL (ref 3.5–5.2)
Alkaline Phosphatase: 61 U/L (ref 39–117)
BUN: 12 mg/dL (ref 6–23)
CO2: 28 meq/L (ref 19–32)
Calcium: 9.8 mg/dL (ref 8.4–10.5)
Chloride: 106 meq/L (ref 96–112)
Creatinine, Ser: 0.68 mg/dL (ref 0.40–1.20)
GFR: 90.06 mL/min (ref >60.00)
Glucose, Bld: 98 mg/dL (ref 70–99)
Potassium: 4.3 meq/L (ref 3.5–5.1)
Sodium: 141 meq/L (ref 135–145)
Total Bilirubin: 0.6 mg/dL (ref 0.2–1.2)
Total Protein: 7.5 g/dL (ref 6.0–8.3)

## 2023-04-10 LAB — TSH: TSH: 3.05 u[IU]/mL (ref 0.35–5.50)

## 2023-04-10 NOTE — Assessment & Plan Note (Signed)
 Chronic Managed by [facility] Completed evenity - now on Prolia every 6 months Continue calcium and vitamin D Exercising regularly

## 2023-04-10 NOTE — Assessment & Plan Note (Signed)
 2/6 systolic heart murmur Echo from 2018 showed trivial MR, but no other valve abnormalities Discussed repeating echo, but the last echo she had there was a significant out-of-pocket cost and she is concerned about that Will defer till next year and she can always call her insurance company

## 2023-04-10 NOTE — Assessment & Plan Note (Addendum)
 Chronic Blood pressure well controlled CBC, CMP, lipids, TSH Continue amlodipine 5 mg daily EKG today: Sinus bradycardia at 56 bpm, otherwise normal EKG.  No change from previous EKG from 2018.

## 2023-04-25 DIAGNOSIS — Z1231 Encounter for screening mammogram for malignant neoplasm of breast: Secondary | ICD-10-CM | POA: Diagnosis not present

## 2023-04-25 LAB — HM MAMMOGRAPHY

## 2023-04-26 ENCOUNTER — Encounter: Payer: Self-pay | Admitting: Internal Medicine

## 2023-05-16 DIAGNOSIS — H43813 Vitreous degeneration, bilateral: Secondary | ICD-10-CM | POA: Diagnosis not present

## 2023-05-16 DIAGNOSIS — H34832 Tributary (branch) retinal vein occlusion, left eye, with macular edema: Secondary | ICD-10-CM | POA: Diagnosis not present

## 2023-05-16 DIAGNOSIS — H35033 Hypertensive retinopathy, bilateral: Secondary | ICD-10-CM | POA: Diagnosis not present

## 2023-05-16 DIAGNOSIS — H2513 Age-related nuclear cataract, bilateral: Secondary | ICD-10-CM | POA: Diagnosis not present

## 2023-05-30 DIAGNOSIS — R768 Other specified abnormal immunological findings in serum: Secondary | ICD-10-CM | POA: Diagnosis not present

## 2023-05-30 DIAGNOSIS — M810 Age-related osteoporosis without current pathological fracture: Secondary | ICD-10-CM | POA: Diagnosis not present

## 2023-05-30 DIAGNOSIS — Z8262 Family history of osteoporosis: Secondary | ICD-10-CM | POA: Diagnosis not present

## 2023-05-30 DIAGNOSIS — Z5181 Encounter for therapeutic drug level monitoring: Secondary | ICD-10-CM | POA: Diagnosis not present

## 2023-06-06 ENCOUNTER — Other Ambulatory Visit: Payer: Self-pay | Admitting: Internal Medicine

## 2023-07-10 ENCOUNTER — Emergency Department (HOSPITAL_BASED_OUTPATIENT_CLINIC_OR_DEPARTMENT_OTHER)
Admission: EM | Admit: 2023-07-10 | Discharge: 2023-07-10 | Disposition: A | Discharge status: Home / Self Care | Attending: Emergency Medicine | Admitting: Emergency Medicine

## 2023-07-10 ENCOUNTER — Emergency Department (HOSPITAL_BASED_OUTPATIENT_CLINIC_OR_DEPARTMENT_OTHER): Admitting: Radiology

## 2023-07-10 ENCOUNTER — Encounter (HOSPITAL_BASED_OUTPATIENT_CLINIC_OR_DEPARTMENT_OTHER): Payer: Self-pay

## 2023-07-10 ENCOUNTER — Emergency Department (HOSPITAL_BASED_OUTPATIENT_CLINIC_OR_DEPARTMENT_OTHER)

## 2023-07-10 ENCOUNTER — Other Ambulatory Visit: Payer: Self-pay

## 2023-07-10 DIAGNOSIS — W0110XA Fall on same level from slipping, tripping and stumbling with subsequent striking against unspecified object, initial encounter: Secondary | ICD-10-CM | POA: Diagnosis not present

## 2023-07-10 DIAGNOSIS — M25532 Pain in left wrist: Secondary | ICD-10-CM | POA: Diagnosis present

## 2023-07-10 DIAGNOSIS — S01511A Laceration without foreign body of lip, initial encounter: Secondary | ICD-10-CM | POA: Insufficient documentation

## 2023-07-10 DIAGNOSIS — S52612K Displaced fracture of left ulna styloid process, subsequent encounter for closed fracture with nonunion: Secondary | ICD-10-CM | POA: Diagnosis not present

## 2023-07-10 DIAGNOSIS — S62327A Displaced fracture of shaft of fifth metacarpal bone, left hand, initial encounter for closed fracture: Secondary | ICD-10-CM | POA: Diagnosis not present

## 2023-07-10 DIAGNOSIS — M1812 Unilateral primary osteoarthritis of first carpometacarpal joint, left hand: Secondary | ICD-10-CM | POA: Diagnosis not present

## 2023-07-10 DIAGNOSIS — S0990XA Unspecified injury of head, initial encounter: Secondary | ICD-10-CM | POA: Insufficient documentation

## 2023-07-10 DIAGNOSIS — S62317K Displaced fracture of base of fifth metacarpal bone. left hand, subsequent encounter for fracture with nonunion: Secondary | ICD-10-CM | POA: Diagnosis not present

## 2023-07-10 DIAGNOSIS — Y9302 Activity, running: Secondary | ICD-10-CM | POA: Insufficient documentation

## 2023-07-10 MED ORDER — LIDOCAINE HCL (PF) 1 % IJ SOLN
5.0000 mL | Freq: Once | INTRAMUSCULAR | Status: AC
  Administered 2023-07-10: 5 mL
  Filled 2023-07-10: qty 5

## 2023-07-10 NOTE — ED Provider Notes (Signed)
 [place] EMERGENCY DEPARTMENT AT [facility] Provider Note   CSN: [id number] Arrival date & time: 07/10/23  1308     History  Chief Complaint  Patient presents with   Fall   Wrist Pain    Left    Mindy Juarez is a 68 y.o. female.  68 year old female presents today for concern of fall that occurred just prior to arrival.  Patient was out running when she tripped and fell forward onto her face.  Denies loss of consciousness.  Is not on blood thinning medicine.  She does endorse pain to her left hand.  Has a laceration to her lip.  The history is provided by the patient. No language interpreter was used.       Home Medications Prior to Admission medications   Medication Sig Start Date End Date Taking? Authorizing Provider  amLODipine (NORVASC) 5 MG tablet TAKE 1 TABLET BY MOUTH EVERY DAY 06/06/23   Colene Dauphin, MD  Calcium Citrate 250 MG TABS Take 1 tablet by mouth daily.     [provider]  cholecalciferol (VITAMIN D ) 1000 UNITS tablet Take 5,000 Units by mouth 3 (three) times a week.     [provider]  glucosamine-chondroitin 500-400 MG tablet Take 3 tablets by mouth daily. 1500/1200    [provider]  Misc Natural Products (TART CHERRY ADVANCED PO) Take 800 mg by mouth daily.     [provider]  Multiple Vitamins-Minerals (MULTIVITAMIN WITH MINERALS) tablet Take 1 tablet by mouth daily.    [provider]  Omega-3 Fatty Acids (FISH OIL PO) Take 448 mg by mouth daily.    [provider]  Romosozumab -aqqg (EVENITY ) 105 MG/1.17ML SOSY injection Inject 210 mg into the skin once. 10/07/21   [provider]      Allergies    Tramadol  and Codeine    Review of Systems   Review of Systems  Constitutional:  Negative for fever.  Musculoskeletal:  Positive for arthralgias.  Skin:  Positive for wound.  Neurological:  Negative for syncope.  All other systems reviewed and are negative.   Physical  Exam Updated Vital Signs BP 133/89   Pulse 63   Temp 98.8 F (37.1 C) (Oral)   Resp 18   Ht 5\' 6"  (1.676 m)   Wt 70.9 kg   LMP 11/08/2006 (Approximate)   SpO2 99%   BMI 25.24 kg/m  Physical Exam Vitals and nursing note reviewed.  Constitutional:      General: She is not in acute distress.    Appearance: Normal appearance. She is not ill-appearing.  HENT:     Head: Normocephalic and atraumatic.     Nose: Nose normal.  Eyes:     Conjunctiva/sclera: Conjunctivae normal.  Cardiovascular:     Rate and Rhythm: Normal rate and regular rhythm.  Pulmonary:     Effort: Pulmonary effort is normal. No respiratory distress.  Musculoskeletal:        General: No deformity. Normal range of motion.     Cervical back: Normal range of motion.     Comments: Cervical, thoracic, lumbar spine without tenderness to palpation.  Full range of motion in all major joints in upper and lower extremities with good strength.  There are some tenderness to palpation over the ulnar side of the left hand with associated skin tear.  Neurovascularly intact bilateral upper extremities.  Skin:    Findings: No rash.  Neurological:     Mental Status: She is alert.  ED Results / Procedures / Treatments   Labs (all labs ordered are listed, but only abnormal results are displayed) Labs Reviewed - No data to display  EKG None  Radiology No results found.  Procedures .Aaron AasLac repair Daurice Ovando  Date/Time: 07/10/2023 11:09 AM  Performed by: Lucina Sabal, PA-C Authorized by: Lucina Sabal, PA-C   Consent:    Consent obtained:  Verbal   Consent given by:  Patient   Risks, benefits, and alternatives were discussed: yes     Risks discussed:  Need for additional repair, infection, retained foreign body, poor cosmetic result and poor wound healing   Alternatives discussed:  No treatment Universal protocol:    Procedure explained and questions answered to patient or proxy's satisfaction: yes     Relevant documents  present and verified: yes     Patient identity confirmed:  Verbally with patient and arm band Laceration details:    Length (cm):  0.5 Pre-procedure details:    Preparation:  Patient was prepped and draped in usual sterile fashion Treatment:    Area cleansed with:  Saline and povidone-iodine   Amount of cleaning:  Extensive   Irrigation solution:  Sterile saline   Irrigation volume:  250   Irrigation method:  Tap   Debridement:  None   Undermining:  None Skin repair:    Repair method:  Sutures   Suture size:  5-0   Suture material:  Chromic gut   Suture technique:  Simple interrupted   Number of sutures:  2 Approximation:    Approximation:  Close Repair type:    Repair type:  Simple Post-procedure details:    Dressing:  Non-adherent dressing   Procedure completion:  Tolerated well, no immediate complications     Medications Ordered in ED Medications - No data to display  ED Course/ Medical Decision Making/ A&P                                 Medical Decision Making Amount and/or Complexity of Data Reviewed Radiology: ordered.  Risk Prescription drug management.   Medical Decision Making / ED Course   This patient presents to the ED for concern of fall, this involves an extensive number of treatment options, and is a complaint that carries with it a high risk of complications and morbidity.  The differential diagnosis includes head injury, laceration, fracture  MDM: 68 year old female presents after a fall.  Not on anticoagulation.  No loss of consciousness.  Does have abrasion to her nose, laceration to her mid upper lip sparing the vermilion border, and a skin tear to the ulnar side of the lateral hand along with some tenderness at this area.  Neurovascularly intact.  Will evaluate with CT head, left hand x-ray.  Last tetanus November 2018 according to chart review. X-ray reveals fracture of the fifth metacarpal.  Discussed with hand surgery.  They agree with  ulnar gutter and follow-up with spears in clinic. CT head without acute concern. Laceration repaired.  See procedure note. Discharged in stable condition.  Return precaution discussed. She has an appointment with dentist later today. Offered pain medication but patient declines at this time.  Lab Tests: -I ordered, reviewed, and interpreted labs.   The pertinent results include:   Labs Reviewed - No data to display    EKG  EKG Interpretation Date/Time:    Ventricular Rate:    PR Interval:    QRS Duration:  QT Interval:    QTC Calculation:   R Axis:      Text Interpretation:           Imaging Studies ordered: I ordered imaging studies including left hand x-ray, CT head I independently visualized and interpreted imaging. I agree with the radiologist interpretation   Medicines ordered and prescription drug management: Meds ordered this encounter  Medications   lidocaine (PF) (XYLOCAINE) 1 % injection 5 mL    -I have reviewed the patients home medicines and have made adjustments as needed   Reevaluation: After the interventions noted above, I reevaluated the patient and found that they have :improved  Co morbidities that complicate the patient evaluation  Past Medical History:  Diagnosis Date   Elevated liver enzymes 2018   History of echocardiogram    Echo 10/18: EF 60-65, normal wall motion, normal diastolic function, mild LAE   Osteoarthritis    +ANA; followed by Rheumatology Alvira Josephs)   Osteoporosis 05/16/2016   Vitamin D  deficiency       Dispostion: Discharged in stable condition.  Return precaution discussed.  Patient voices understanding and is in agreement with plan.   Final Clinical Impression(s) / ED Diagnoses Final diagnoses:  Lip laceration, initial encounter  Closed displaced fracture of shaft of fifth metacarpal bone of left hand, initial encounter    Rx / DC Orders ED Discharge Orders     None         Lucina Sabal,  PA-C 07/10/23 1112    Lind Repine, MD 07/10/23 1523

## 2023-07-10 NOTE — ED Triage Notes (Signed)
 Patient reports falling one hour ago while out running and her toe got caught and she fell face first. -LOC and no anticoagulants. She has abrasions to the face with no bleeding. Her left wrist and 5th digit is what is bothering her the most at this time, she states she cannot move it well.

## 2023-07-10 NOTE — Discharge Instructions (Signed)
 Your x-ray showed fracture of the fifth metacarpal.  I have discussed this with hand specialist who recommends splint and follow-up in their clinic.  I have listed Dr. Delmar Ferrara information above.  Take Tylenol 1000 mg every 6 hours for pain control and you can take ibuprofen 600 mg in addition to this if you need additional pain control.  You can ice the area for 10 to 15 minutes every 4-6 hours.  Return for any emergent symptoms.  Follow-up with your dentist. Your lip laceration was repaired with 2 stitches.  These will dissolve on their own.

## 2023-07-12 DIAGNOSIS — S62307A Unspecified fracture of fifth metacarpal bone, left hand, initial encounter for closed fracture: Secondary | ICD-10-CM | POA: Diagnosis not present

## 2023-07-17 DIAGNOSIS — S62307D Unspecified fracture of fifth metacarpal bone, left hand, subsequent encounter for fracture with routine healing: Secondary | ICD-10-CM | POA: Diagnosis not present

## 2023-08-14 DIAGNOSIS — S62307D Unspecified fracture of fifth metacarpal bone, left hand, subsequent encounter for fracture with routine healing: Secondary | ICD-10-CM | POA: Diagnosis not present

## 2023-08-17 DIAGNOSIS — M25642 Stiffness of left hand, not elsewhere classified: Secondary | ICD-10-CM | POA: Diagnosis not present

## 2023-08-22 DIAGNOSIS — M25642 Stiffness of left hand, not elsewhere classified: Secondary | ICD-10-CM | POA: Diagnosis not present

## 2023-08-22 DIAGNOSIS — M79642 Pain in left hand: Secondary | ICD-10-CM | POA: Diagnosis not present

## 2023-08-24 DIAGNOSIS — L568 Other specified acute skin changes due to ultraviolet radiation: Secondary | ICD-10-CM | POA: Diagnosis not present

## 2023-08-25 DIAGNOSIS — M25642 Stiffness of left hand, not elsewhere classified: Secondary | ICD-10-CM | POA: Diagnosis not present

## 2023-08-25 DIAGNOSIS — M79642 Pain in left hand: Secondary | ICD-10-CM | POA: Diagnosis not present

## 2023-08-29 DIAGNOSIS — H25013 Cortical age-related cataract, bilateral: Secondary | ICD-10-CM | POA: Diagnosis not present

## 2023-08-29 DIAGNOSIS — H5203 Hypermetropia, bilateral: Secondary | ICD-10-CM | POA: Diagnosis not present

## 2023-08-29 DIAGNOSIS — H04123 Dry eye syndrome of bilateral lacrimal glands: Secondary | ICD-10-CM | POA: Diagnosis not present

## 2023-08-29 DIAGNOSIS — H524 Presbyopia: Secondary | ICD-10-CM | POA: Diagnosis not present

## 2023-08-29 DIAGNOSIS — M79642 Pain in left hand: Secondary | ICD-10-CM | POA: Diagnosis not present

## 2023-08-29 DIAGNOSIS — H52203 Unspecified astigmatism, bilateral: Secondary | ICD-10-CM | POA: Diagnosis not present

## 2023-08-29 DIAGNOSIS — H348322 Tributary (branch) retinal vein occlusion, left eye, stable: Secondary | ICD-10-CM | POA: Diagnosis not present

## 2023-08-29 DIAGNOSIS — M25642 Stiffness of left hand, not elsewhere classified: Secondary | ICD-10-CM | POA: Diagnosis not present

## 2023-08-29 DIAGNOSIS — H2513 Age-related nuclear cataract, bilateral: Secondary | ICD-10-CM | POA: Diagnosis not present

## 2023-09-01 DIAGNOSIS — M79642 Pain in left hand: Secondary | ICD-10-CM | POA: Diagnosis not present

## 2023-09-01 DIAGNOSIS — M25642 Stiffness of left hand, not elsewhere classified: Secondary | ICD-10-CM | POA: Diagnosis not present

## 2023-09-05 DIAGNOSIS — M79642 Pain in left hand: Secondary | ICD-10-CM | POA: Diagnosis not present

## 2023-09-05 DIAGNOSIS — M25642 Stiffness of left hand, not elsewhere classified: Secondary | ICD-10-CM | POA: Diagnosis not present

## 2023-09-07 DIAGNOSIS — M79642 Pain in left hand: Secondary | ICD-10-CM | POA: Diagnosis not present

## 2023-09-07 DIAGNOSIS — M25642 Stiffness of left hand, not elsewhere classified: Secondary | ICD-10-CM | POA: Diagnosis not present

## 2023-09-11 DIAGNOSIS — M25642 Stiffness of left hand, not elsewhere classified: Secondary | ICD-10-CM | POA: Diagnosis not present

## 2023-09-13 DIAGNOSIS — S62307A Unspecified fracture of fifth metacarpal bone, left hand, initial encounter for closed fracture: Secondary | ICD-10-CM | POA: Diagnosis not present

## 2023-09-14 DIAGNOSIS — M25642 Stiffness of left hand, not elsewhere classified: Secondary | ICD-10-CM | POA: Diagnosis not present

## 2023-09-14 DIAGNOSIS — M79642 Pain in left hand: Secondary | ICD-10-CM | POA: Diagnosis not present

## 2023-09-18 DIAGNOSIS — M79642 Pain in left hand: Secondary | ICD-10-CM | POA: Diagnosis not present

## 2023-09-18 DIAGNOSIS — M25642 Stiffness of left hand, not elsewhere classified: Secondary | ICD-10-CM | POA: Diagnosis not present

## 2023-09-22 DIAGNOSIS — M79642 Pain in left hand: Secondary | ICD-10-CM | POA: Diagnosis not present

## 2023-09-22 DIAGNOSIS — M25642 Stiffness of left hand, not elsewhere classified: Secondary | ICD-10-CM | POA: Diagnosis not present

## 2023-09-25 DIAGNOSIS — M25642 Stiffness of left hand, not elsewhere classified: Secondary | ICD-10-CM | POA: Diagnosis not present

## 2023-09-25 DIAGNOSIS — M79642 Pain in left hand: Secondary | ICD-10-CM | POA: Diagnosis not present

## 2023-09-28 DIAGNOSIS — M25642 Stiffness of left hand, not elsewhere classified: Secondary | ICD-10-CM | POA: Diagnosis not present

## 2023-09-28 DIAGNOSIS — M79642 Pain in left hand: Secondary | ICD-10-CM | POA: Diagnosis not present

## 2023-10-03 ENCOUNTER — Encounter: Payer: Self-pay | Admitting: Internal Medicine

## 2023-10-03 ENCOUNTER — Ambulatory Visit (INDEPENDENT_AMBULATORY_CARE_PROVIDER_SITE_OTHER): Payer: Medicare PPO

## 2023-10-03 VITALS — Ht 65.5 in | Wt 156.0 lb

## 2023-10-03 DIAGNOSIS — Z0000 Encounter for general adult medical examination without abnormal findings: Secondary | ICD-10-CM

## 2023-10-03 DIAGNOSIS — M25642 Stiffness of left hand, not elsewhere classified: Secondary | ICD-10-CM | POA: Diagnosis not present

## 2023-10-03 DIAGNOSIS — M79642 Pain in left hand: Secondary | ICD-10-CM | POA: Diagnosis not present

## 2023-10-03 NOTE — Progress Notes (Signed)
 Subjective:   Mindy Juarez is a 68 y.o. who presents for a Medicare Wellness preventive visit.  As a reminder, Annual Wellness Visits don't include a physical exam, and some assessments may be limited, especially if this visit is performed virtually. We may recommend an in-person follow-up visit with your provider if needed.  Visit Complete: Virtual I connected with  Mindy Juarez on 10/03/23 by a audio enabled telemedicine application and verified that I am speaking with the correct person using two identifiers.  Patient Location: Home  Provider Location: Home Office  I discussed the limitations of evaluation and management by telemedicine. The patient expressed understanding and agreed to proceed.  Vital Signs: Because this visit was a virtual/telehealth visit, some criteria may be missing or patient reported. Any vitals not documented were not able to be obtained and vitals that have been documented are patient reported.  VideoDeclined- This patient declined Librarian, academic. Therefore the visit was completed with audio only.  Persons Participating in Visit: Patient.  AWV Questionnaire: Yes: Patient Medicare AWV questionnaire was completed by the patient on 09/26/2023; I have confirmed that all information answered by patient is correct and no changes since this date.  Cardiac Risk Factors include: advanced age (>1men, >48 women);hypertension     Objective:    Today's Vitals   10/03/23 1338  Weight: 156 lb (70.8 kg)  Height: 5' 5.5 (1.664 m)   Body mass index is 25.56 kg/m.     10/03/2023    1:44 PM 07/10/2023    9:02 AM 09/30/2022   11:26 AM 07/14/2017   11:47 AM 02/27/2017    1:35 PM 05/19/2016    2:23 PM 04/05/2016    2:19 PM  Advanced Directives  Does Patient Have a Medical Advance Directive? Yes No Yes Yes  No  Yes  Yes   Type of Estate agent of [place];Living will  Healthcare Power of [place];Living will  Healthcare Power of [employer] of [place];Living will Healthcare Power of [place];Living will  Copy of Healthcare Power of Attorney in Chart? No - copy requested  No - copy requested      Would patient like information on creating a medical advance directive?     No - Patient declined        Data saved with a previous flowsheet row definition    Current Medications (verified) Outpatient Encounter Medications as of 10/03/2023  Medication Sig   amLODipine (NORVASC) 5 MG tablet TAKE 1 TABLET BY MOUTH EVERY DAY   Calcium Citrate 250 MG TABS Take 1 tablet by mouth daily.    cholecalciferol (VITAMIN D ) 1000 UNITS tablet Take 5,000 Units by mouth 3 (three) times a week.    glucosamine-chondroitin 500-400 MG tablet Take 3 tablets by mouth daily. 1500/1200   Misc Natural Products (TART CHERRY ADVANCED PO) Take 800 mg by mouth daily.    Multiple Vitamins-Minerals (MULTIVITAMIN WITH MINERALS) tablet Take 1 tablet by mouth daily.   Omega-3 Fatty Acids (FISH OIL PO) Take 448 mg by mouth daily.   Romosozumab -aqqg (EVENITY ) 105 MG/1.17ML SOSY injection Inject 210 mg into the skin once.   No facility-administered encounter medications on file as of 10/03/2023.    Allergies (verified) Tramadol  and Codeine   History: Past Medical History:  Diagnosis Date   Elevated liver enzymes 2018   History of echocardiogram    Echo 10/18: EF 60-65, normal wall motion, normal diastolic function, mild LAE   Osteoarthritis    +  ANA; followed by Rheumatology (Deveshwar)   Osteoporosis 05/16/2016   Vitamin D  deficiency    Past Surgical History:  Procedure Laterality Date   CARPAL TUNNEL RELEASE Right 2009   CESAREAN SECTION  1987, 1991   HIP ARTHROSCOPY W/ LABRAL REPAIR Left 2007   TRIGGER FINGER RELEASE     thumb   Family History  Problem Relation Age of Onset   Osteoporosis Mother    Hypertension Mother    Hyperlipidemia Mother    Hypertrophic cardiomyopathy Mother 43   Macular  degeneration Mother    Prostate cancer Father 83       no surgery   Stroke Father        died from stroke   Diabetes Maternal Aunt    Diabetes Maternal Grandmother    Heart failure Maternal Grandfather    Stroke Paternal Grandfather    Sudden Cardiac Death Neg Hx    Social History   Socioeconomic History   Marital status: Married    Spouse name: Mindy Juarez   Number of children: 2   Years of education: Not on file   Highest education level: Master's degree (e.g., MA, MS, MEng, MEd, MSW, MBA)  Occupational History   Occupation: Geneticist, molecular   Occupation: Retired  Tobacco Use   Smoking status: Former   Smokeless tobacco: Never   Tobacco comments:    high Air traffic controller   Vaping status: Never Used  Substance and Sexual Activity   Alcohol use: Yes    Alcohol/week: 4.0 standard drinks of alcohol    Types: 4 Glasses of wine per week   Drug use: No   Sexual activity: Not on file  Other Topics Concern   Not on file  Social History Narrative   [employer] - Geneticist, molecular with [employer]; School in [place]   Special ed teacher for 20 years   Married, 2 sons      Lives with husband and has 1 dog/2025   Social Drivers of Corporate investment banker Strain: Low Risk  (09/26/2023)   Overall Financial Resource Strain (CARDIA)    Difficulty of Paying Living Expenses: Not hard at all  Food Insecurity: No Food Insecurity (09/26/2023)   Hunger Vital Sign    Worried About Running Out of Food in the Last Year: Never true    Ran Out of Food in the Last Year: Never true  Transportation Needs: No Transportation Needs (09/26/2023)   PRAPARE - Administrator, Civil Service (Medical): No    Lack of Transportation (Non-Medical): No  Physical Activity: Sufficiently Active (09/26/2023)   Exercise Vital Sign    Days of Exercise per Week: 6 days    Minutes of Exercise per Session: 60 min  Stress: Stress Concern Present (09/26/2023)   [employer] of  Occupational Health - Occupational Stress Questionnaire    Feeling of Stress: To some extent  Social Connections: Socially Integrated (09/26/2023)   Social Connection and Isolation Panel    Frequency of Communication with Friends and Family: More than three times a week    Frequency of Social Gatherings with Friends and Family: Three times a week    Attends Religious Services: More than 4 times per year    Active Member of Clubs or Organizations: Yes    Attends Banker Meetings: More than 4 times per year    Marital Status: Married    Tobacco Counseling Counseling given: Not Answered Tobacco comments: high school/college  Clinical Intake:  Pre-visit preparation completed: Yes  Pain : No/denies pain     BMI - recorded: 25.56 Nutritional Status: BMI 25 -29 Overweight Nutritional Risks: None Diabetes: No  Lab Results  Component Value Date   HGBA1C 5.5 06/10/2016     How often do you need to have someone help you when you read instructions, pamphlets, or other written materials from your doctor or pharmacy?: 1 - Never  Interpreter Needed?: No  Information entered by :: Mindy Juarez, RMA   Activities of Daily Living     09/26/2023   10:28 AM  In your present state of health, do you have any difficulty performing the following activities:  Hearing? 0  Vision? 0  Difficulty concentrating or making decisions? 0  Walking or climbing stairs? 0  Dressing or bathing? 0  Doing errands, shopping? 0  Preparing Food and eating ? N  Using the Toilet? N  In the past six months, have you accidently leaked urine? N  Do you have problems with loss of bowel control? N  Managing your Medications? N  Managing your Finances? N  Housekeeping or managing your Housekeeping? N    Patient Care Team: Geofm Glade PARAS, MD as PCP - General (Internal Medicine) Wonda Cy BROCKS, RD as Dietitian (Family Medicine) Patrcia Sharper, MD as Consulting Physician  (Ophthalmology) Josh Server, MD as Referring Physician (Ophthalmology)  I have updated your Care Teams any recent Medical Services you may have received from other providers in the past year.     Assessment:   This is a routine wellness examination for Mindy Juarez.  Hearing/Vision screen Hearing Screening - Comments:: Denies hearing difficulties   Vision Screening - Comments:: Wears eyeglasses/ Dr. Patrcia   Goals Addressed               This Visit's Progress     Patient Stated (pt-stated)   Not on track     Would like to drop a few lbs. Still working on it/2025       Depression Screen     10/03/2023    1:46 PM 04/10/2023    1:05 PM 09/30/2022   11:32 AM 04/07/2022   11:02 AM 10/05/2021    1:02 PM 05/19/2016    2:22 PM 04/05/2016    2:18 PM  PHQ 2/9 Scores  PHQ - 2 Score 0 0 0 0 0 0 0  PHQ- 9 Score 0  1        Fall Risk     09/26/2023   10:28 AM 04/10/2023    1:05 PM 09/30/2022   11:27 AM 04/07/2022   11:02 AM 10/05/2021    1:02 PM  Fall Risk   Falls in the past year? 1 0 0 0 0  Number falls in past yr: 0 0 0 0 0  Injury with Fall? 1 0 0 0 0  Comment fratured hand      Risk for fall due to :  No Fall Risks No Fall Risks No Fall Risks No Fall Risks  Follow up Falls evaluation completed;Falls prevention discussed Falls evaluation completed Falls prevention discussed;Falls evaluation completed Falls evaluation completed Falls evaluation completed      Data saved with a previous flowsheet row definition    MEDICARE RISK AT HOME:  Medicare Risk at Home Any stairs in or around the home?: (Patient-Rptd) Yes If so, are there any without handrails?: (Patient-Rptd) No Home free of loose throw rugs in walkways, pet beds, electrical cords, etc?: (Patient-Rptd) Yes  Adequate lighting in your home to reduce risk of falls?: (Patient-Rptd) Yes Life alert?: (Patient-Rptd) No Use of a cane, walker or w/c?: (Patient-Rptd) No Grab bars in the bathroom?: (Patient-Rptd) Yes Shower chair  or bench in shower?: (Patient-Rptd) Yes Elevated toilet seat or a handicapped toilet?: (Patient-Rptd) Yes  TIMED UP AND GO:  Was the test performed?  No  Cognitive Function: Declined/Normal: No cognitive concerns noted by patient or family. Patient alert, oriented, able to answer questions appropriately and recall recent events. No signs of memory loss or confusion.        09/30/2022   11:28 AM  6CIT Screen  What Year? 0 points  What month? 0 points  What time? 0 points  Count back from 20 0 points  Months in reverse 0 points  Repeat phrase 0 points  Total Score 0 points    Immunizations Immunization History  Administered Date(s) Administered   Fluad Quad(high Dose 65+) 11/03/2021   Influenza-Unspecified 11/14/2016, 11/15/2022   PNEUMOCOCCAL CONJUGATE-20 07/22/2021   Pfizer Covid-19 Vaccine Bivalent Booster 11yrs & up 11/03/2021   Tdap 12/09/2006, 12/08/2016   Zoster Recombinant(Shingrix) 06/19/2019, 10/24/2019    Screening Tests Health Maintenance  Topic Date Due   COVID-19 Vaccine (2 - Pfizer risk series) 11/24/2021   Medicare Annual Wellness (AWV)  09/30/2023   INFLUENZA VACCINE  09/08/2023   MAMMOGRAM  04/24/2024   DTaP/Tdap/Td (3 - Td or Tdap) 12/09/2026   Colonoscopy  10/30/2032   Pneumococcal Vaccine: 50+ Years  Completed   DEXA SCAN  Completed   Hepatitis C Screening  Completed   Zoster Vaccines- Shingrix  Completed   HPV VACCINES  Aged Out   Meningococcal B Vaccine  Aged Out    Health Maintenance  Health Maintenance Due  Topic Date Due   COVID-19 Vaccine (2 - Pfizer risk series) 11/24/2021   Medicare Annual Wellness (AWV)  09/30/2023   INFLUENZA VACCINE  09/08/2023   Health Maintenance Items Addressed: See Nurse Notes at the end of this note  Additional Screening:  Vision Screening: Recommended annual ophthalmology exams for early detection of glaucoma and other disorders of the eye. Would you like a referral to an eye doctor? No    Dental  Screening: Recommended annual dental exams for proper oral hygiene  Community Resource Referral / Chronic Care Management: CRR required this visit?  No   CCM required this visit?  No   Plan:    I have personally reviewed and noted the following in the patient's chart:   Medical and social history Use of alcohol, tobacco or illicit drugs  Current medications and supplements including opioid prescriptions. Patient is not currently taking opioid prescriptions. Functional ability and status Nutritional status Physical activity Advanced directives List of other physicians Hospitalizations, surgeries, and ER visits in previous 12 months Vitals Screenings to include cognitive, depression, and falls Referrals and appointments  In addition, I have reviewed and discussed with patient certain preventive protocols, quality metrics, and best practice recommendations. A written personalized care plan for preventive services as well as general preventive health recommendations were provided to patient.   Derec Mozingo L Gurfateh Mcclain, CMA   10/03/2023   After Visit Summary: (MyChart) Due to this being a telephonic visit, the after visit summary with patients personalized plan was offered to patient via MyChart   Notes: Patient is up to date on all health maintenance.  Patient would like to discuss the Covid vaccine during her next office visit.

## 2023-10-03 NOTE — Patient Instructions (Signed)
 Mindy Juarez , Thank you for taking time out of your busy schedule to complete your Annual Wellness Visit with me. I enjoyed our conversation and look forward to speaking with you again next year. I, as well as your care team,  appreciate your ongoing commitment to your health goals. Please review the following plan we discussed and let me know if I can assist you in the future. Your Game plan/ To Do List    Follow up Visits: We will see or speak with you next year for your Next Medicare AWV with our clinical staff Have you seen your provider in the last 6 months (3 months if uncontrolled diabetes)? Yes.  Last office visit on 04/10/2023.  Clinician Recommendations:  Aim for 30 minutes of exercise or brisk walking, 6-8 glasses of water, and 5 servings of fruits and vegetables each day. Keep up the good work.      This is a list of the screenings recommended for you:  Health Maintenance  Topic Date Due   COVID-19 Vaccine (2 - Pfizer risk series) 11/24/2021   Medicare Annual Wellness Visit  09/30/2023   Flu Shot  09/08/2023   Mammogram  04/24/2024   DTaP/Tdap/Td vaccine (3 - Td or Tdap) 12/09/2026   Colon Cancer Screening  10/30/2032   Pneumococcal Vaccine for age over 12  Completed   DEXA scan (bone density measurement)  Completed   Hepatitis C Screening  Completed   Zoster (Shingles) Vaccine  Completed   HPV Vaccine  Aged Out   Meningitis B Vaccine  Aged Out    Advanced directives: (Copy Requested) Please bring a copy of your health care power of attorney and living will to the office to be added to your chart at your convenience. You can mail to [facility] [address] [place], [place] [address] or email to [email or web address] Advance Care Planning is important because it:  [x]  Makes sure you receive the medical care that is consistent with your values, goals, and preferences  [x]  It provides guidance to your family and loved ones and reduces their  decisional burden about whether or not they are making the right decisions based on your wishes.  Follow the link provided in your after visit summary or read over the paperwork we have mailed to you to help you started getting your Advance Directives in place. If you need assistance in completing these, please reach out to us  so that we can help you!  See attachments for Preventive Care and Fall Prevention Tips.

## 2023-10-05 DIAGNOSIS — M25642 Stiffness of left hand, not elsewhere classified: Secondary | ICD-10-CM | POA: Diagnosis not present

## 2023-10-11 DIAGNOSIS — S62307D Unspecified fracture of fifth metacarpal bone, left hand, subsequent encounter for fracture with routine healing: Secondary | ICD-10-CM | POA: Diagnosis not present

## 2023-10-11 DIAGNOSIS — S62307A Unspecified fracture of fifth metacarpal bone, left hand, initial encounter for closed fracture: Secondary | ICD-10-CM | POA: Diagnosis not present

## 2023-10-13 DIAGNOSIS — M79642 Pain in left hand: Secondary | ICD-10-CM | POA: Diagnosis not present

## 2023-10-13 DIAGNOSIS — M25642 Stiffness of left hand, not elsewhere classified: Secondary | ICD-10-CM | POA: Diagnosis not present

## 2023-10-23 DIAGNOSIS — M25642 Stiffness of left hand, not elsewhere classified: Secondary | ICD-10-CM | POA: Diagnosis not present

## 2023-10-23 DIAGNOSIS — M79642 Pain in left hand: Secondary | ICD-10-CM | POA: Diagnosis not present

## 2023-10-31 DIAGNOSIS — M25642 Stiffness of left hand, not elsewhere classified: Secondary | ICD-10-CM | POA: Diagnosis not present

## 2023-10-31 DIAGNOSIS — M79642 Pain in left hand: Secondary | ICD-10-CM | POA: Diagnosis not present

## 2023-11-05 ENCOUNTER — Encounter: Payer: Self-pay | Admitting: Internal Medicine

## 2023-11-07 DIAGNOSIS — M25642 Stiffness of left hand, not elsewhere classified: Secondary | ICD-10-CM | POA: Diagnosis not present

## 2023-11-14 DIAGNOSIS — M25642 Stiffness of left hand, not elsewhere classified: Secondary | ICD-10-CM | POA: Diagnosis not present

## 2023-11-22 DIAGNOSIS — S62307D Unspecified fracture of fifth metacarpal bone, left hand, subsequent encounter for fracture with routine healing: Secondary | ICD-10-CM | POA: Diagnosis not present

## 2023-11-24 DIAGNOSIS — M8588 Other specified disorders of bone density and structure, other site: Secondary | ICD-10-CM | POA: Diagnosis not present

## 2023-11-24 LAB — HM DEXA SCAN

## 2023-11-30 DIAGNOSIS — M25642 Stiffness of left hand, not elsewhere classified: Secondary | ICD-10-CM | POA: Diagnosis not present

## 2023-12-14 DIAGNOSIS — M810 Age-related osteoporosis without current pathological fracture: Secondary | ICD-10-CM | POA: Diagnosis not present

## 2023-12-14 DIAGNOSIS — R7689 Other specified abnormal immunological findings in serum: Secondary | ICD-10-CM | POA: Diagnosis not present

## 2023-12-14 DIAGNOSIS — Z8262 Family history of osteoporosis: Secondary | ICD-10-CM | POA: Diagnosis not present

## 2023-12-14 DIAGNOSIS — Z5181 Encounter for therapeutic drug level monitoring: Secondary | ICD-10-CM | POA: Diagnosis not present

## 2023-12-15 DIAGNOSIS — M25642 Stiffness of left hand, not elsewhere classified: Secondary | ICD-10-CM | POA: Diagnosis not present

## 2024-01-12 DIAGNOSIS — M25642 Stiffness of left hand, not elsewhere classified: Secondary | ICD-10-CM | POA: Diagnosis not present

## 2024-01-30 ENCOUNTER — Other Ambulatory Visit: Payer: Self-pay

## 2024-01-30 ENCOUNTER — Ambulatory Visit: Admitting: Sports Medicine

## 2024-01-30 VITALS — BP 137/69 | Ht 65.5 in | Wt 156.0 lb

## 2024-01-30 DIAGNOSIS — S300XXA Contusion of lower back and pelvis, initial encounter: Secondary | ICD-10-CM | POA: Diagnosis not present

## 2024-01-30 DIAGNOSIS — M25551 Pain in right hip: Secondary | ICD-10-CM | POA: Diagnosis not present

## 2024-01-30 NOTE — Assessment & Plan Note (Signed)
 This is chronic now 4 to 25 months old and I am not sure that aspiration would be of any value I plan to research whether shockwave or other therapies might help her resolve this more quickly We will give her exercises to work on hip weakness associated with this

## 2024-01-30 NOTE — Progress Notes (Signed)
 Discussed the use of AI scribe software for clinical note transcription with the patient, who gave verbal consent to proceed.  History of Present Illness Mindy Juarez is a 68 year old female with right piriformis syndrome and prior left hip labral repair who presents for evaluation of a persistent, palpable knot in the right buttock and associated functional limitations.  Right buttock pain and palpable mass - Intermittent right buttock pain for 1-2 years, previously attributed to piriformis syndrome - Pain triggered by running or sprinting, severe enough to abruptly stop activity and cause difficulty walking - Currently unable to run due to episodes of muscle seizing, requiring her to hobble home - Persistent, palpable knot in the right buttock discovered in August during massage - Mass has become more prominent, hard, localized, and visible when standing - Not consistently painful but causes a dull ache, especially with prolonged sitting such as long car trips - No sciatica - One brief episode of low back pain after prolonged sitting, resolved in two days - No significant adult trauma to the right hip She does recall 1 fall when she may have stretched her right buttocks although she landed on her left side.  She also remembers doing sprints in the gym and had a sharp spasm in her right buttocks both of these were about 4 to 6 months ago when the symptoms started  Hip flexibility and mobility limitations - History of childhood hip injury at age 32 requiring body cast, resulting in lifelong decreased hip flexibility and mobility - Left hip labral repair in 2007 - Ongoing bilateral hip tightness and limited flexibility  Recurrent falls and gait instability - Multiple falls, most recently on June 2 while running, resulting in left hand fracture - Frequent tripping, especially catching the left toe - Falls occur during running, walking the dog, or climbing steps - Concern about fall  frequency and safety  Functional status and activity level - Remains active despite symptoms - Participates in water fitness classes - Walks dog 2-3 miles daily - Attends weekly yoga - Trains at the gym twice weekly  Prior and current treatments - Persistent right buttock mass despite use of magnesium cream, Voltaren , stretching, and regular massages  Physical Exam GENERAL: Pleasant older lady, no acute distress. BP 137/69   Ht 5' 5.5 (1.664 m)   Wt 156 lb (70.8 kg)   LMP 11/08/2006   BMI 25.56 kg/m   MUSCULOSKELETAL: Right hip internal rotation 30 degrees, external rotation 30 degrees. Left hip internal rotation 30 degrees, external rotation 35 degrees. Right straight leg raise to 80 degrees. Left straight leg raise to 90 degrees, no symptoms. Right Faber test tight, left better. Fadir test good bilaterally. 90/90 position increases rotation by 10 degrees bilaterally. Left hip abduction strength good, right hip abduction weak. Right hip rotator weak. Hard lump in right piriformis tendon area, 4 cm x 2 cm. Low back non-tender to palpation.  Ultrasound of right buttocks There is a large hypoechoic area suggestive of a hematoma directly over the palpable lump in her piriformis area. This measures 2-1/2 cm AP and about 4-1/2 cm in a horizontal plane There may be some partial tearing of the piriformis tendon just distal to this Other tendons inserting into the greater trochanter look normal  Impression; chronic hematoma of the right buttocks possibly from a partial tear of her piriformis muscle  Ultrasound and interpretation by Mindy KATHEE. Jacobs Golab, MD   Assessment & Plan Right piriformis syndrome with palpable mass and hip  weakness Chronic right piriformis pain with firm, minimally tender mass at piriformis tendon near greater trochanter, associated with hip abduction and rotator weakness. Possible prior partial piriformis tear with retracted scar tissue. Differential includes tendon  tear, scar tissue, or other local pathology. - Ordered right hip/buttock ultrasound to characterize mass and assess for tendon tear or other pathology.  History of left hip labral repair with bilateral hip tightness Remote left hip labral repair with ongoing bilateral hip tightness and limited flexibility, without acute symptoms. - Assessed hip range of motion and strength.  Osteoporosis Osteoporosis managed with Prolia , with ongoing follow-up at bone prevention clinic. No acute issues addressed. - Documented osteoporosis management and ongoing follow-up with bone prevention clinic.  Recurrent falls Multiple recent falls, primarily outdoors, with one resulting in left hand fracture. Frequent tripping, especially catching left toe. - Planned comprehensive evaluation for recurrent falls, including neurological assessment and special tests at future visit.  Osteoarthritis of feet and knees Osteoarthritis of feet and knees contributing to limited ankle and foot mobility and possibly to fall risk. - Documented osteoarthritis as contributing factor to mobility and fall risk.

## 2024-02-20 ENCOUNTER — Ambulatory Visit: Admitting: Sports Medicine

## 2024-03-19 ENCOUNTER — Ambulatory Visit: Admitting: Sports Medicine

## 2024-04-10 ENCOUNTER — Encounter: Admitting: Internal Medicine

## 2024-10-03 ENCOUNTER — Ambulatory Visit
